# Patient Record
Sex: Male | Born: 1980 | Race: White | Hispanic: No | Marital: Married | Smoking: Never smoker
Health system: Southern US, Community
[De-identification: ages and names within clinical notes are randomized; demographics above are authoritative.]

## PROBLEM LIST (undated history)

## (undated) DIAGNOSIS — K589 Irritable bowel syndrome without diarrhea: Secondary | ICD-10-CM

## (undated) DIAGNOSIS — K649 Unspecified hemorrhoids: Secondary | ICD-10-CM

## (undated) HISTORY — DX: Unspecified hemorrhoids: K64.9

## (undated) HISTORY — DX: Gilbert syndrome: E80.4

## (undated) HISTORY — PX: WISDOM TOOTH EXTRACTION: SHX21

---

## 2014-07-14 ENCOUNTER — Ambulatory Visit (INDEPENDENT_AMBULATORY_CARE_PROVIDER_SITE_OTHER): Payer: BLUE CROSS/BLUE SHIELD | Admitting: Emergency Medicine

## 2014-07-14 VITALS — BP 132/64 | HR 91 | Temp 98.0°F | Resp 15 | Ht 69.25 in | Wt 178.0 lb

## 2014-07-14 DIAGNOSIS — Z0000 Encounter for general adult medical examination without abnormal findings: Secondary | ICD-10-CM | POA: Diagnosis not present

## 2014-07-14 LAB — CBC WITH DIFFERENTIAL/PLATELET
Basophils Absolute: 0 10*3/uL (ref 0.0–0.1)
Basophils Relative: 0 % (ref 0–1)
Eosinophils Absolute: 0.1 10*3/uL (ref 0.0–0.7)
Eosinophils Relative: 1 % (ref 0–5)
HCT: 46.6 % (ref 39.0–52.0)
Hemoglobin: 16 g/dL (ref 13.0–17.0)
Lymphocytes Relative: 16 % (ref 12–46)
Lymphs Abs: 2 10*3/uL (ref 0.7–4.0)
MCH: 29.3 pg (ref 26.0–34.0)
MCHC: 34.3 g/dL (ref 30.0–36.0)
MCV: 85.2 fL (ref 78.0–100.0)
MONO ABS: 0.6 10*3/uL (ref 0.1–1.0)
MONOS PCT: 5 % (ref 3–12)
MPV: 11.2 fL (ref 8.6–12.4)
NEUTROS ABS: 9.9 10*3/uL — AB (ref 1.7–7.7)
Neutrophils Relative %: 78 % — ABNORMAL HIGH (ref 43–77)
Platelets: 256 10*3/uL (ref 150–400)
RBC: 5.47 MIL/uL (ref 4.22–5.81)
RDW: 13.9 % (ref 11.5–15.5)
WBC: 12.7 10*3/uL — ABNORMAL HIGH (ref 4.0–10.5)

## 2014-07-14 LAB — POCT URINALYSIS DIPSTICK
BILIRUBIN UA: NEGATIVE
GLUCOSE UA: NEGATIVE
Ketones, UA: NEGATIVE
Leukocytes, UA: NEGATIVE
NITRITE UA: NEGATIVE
PH UA: 6
Protein, UA: NEGATIVE
RBC UA: NEGATIVE
Spec Grav, UA: 1.01
Urobilinogen, UA: 0.2

## 2014-07-14 NOTE — Progress Notes (Signed)
[facility] [address], [place] [address] [phone]  Date:  07/14/2014   Name:  Billy Phillips   DOB:  12/07/1980   MRN:  [id number]  PCP:  No primary care provider on file.    Chief Complaint: Annual Exam   History of Present Illness:  Billy Phillips is a 34 y.o. very pleasant male patient who presents with the following:  For PE.  No medical history of consequence Strong family history of HBP and HLD Nomedications Non smoker No improvement with over the counter medications or other home remedies.   Denies other complaint or health concern today.   There are no active problems to display for this patient.   History reviewed. No pertinent past medical history.  History reviewed. No pertinent past surgical history.  History  Substance Use Topics  . Smoking status: Never Smoker   . Smokeless tobacco: Not on file  . Alcohol Use: No    Family History  Problem Relation Age of Onset  . Hyperlipidemia Father   . Hypertension Father   . Hyperlipidemia Sister   . Cancer Maternal Grandmother   . Hyperlipidemia Paternal Grandmother   . Diabetes Paternal Grandfather     Not on File  Medication list has been reviewed and updated.  No current outpatient prescriptions on file prior to visit.   No current facility-administered medications on file prior to visit.    Review of Systems:  As per HPI, otherwise negative.    Physical Examination: Filed Vitals:   07/14/14 1354  BP: 160/70  Pulse: 91  Temp: 98 F (36.7 C)  Resp: 15   Filed Vitals:   07/14/14 1354  Height: 5' 9.25" (1.759 m)  Weight: 178 lb (80.74 kg)   Body mass index is 26.09 kg/(m^2). Ideal Body Weight: Weight in (lb) to have BMI = 25: 170.2  GEN: WDWN, NAD, Non-toxic, A & O x 3 HEENT: Atraumatic, Normocephalic. Neck supple. No masses, No LAD. Ears and Nose: No external deformity. CV: RRR, No M/G/R. No JVD. No thrill. No extra heart sounds. PULM: CTA  B, no wheezes, crackles, rhonchi. No retractions. No resp. distress. No accessory muscle use. ABD: S, NT, ND, +BS. No rebound. No HSM. EXTR: No c/c/e NEURO Normal gait.  PSYCH: Normally interactive. Conversant. Not depressed or anxious appearing.  Calm demeanor.    Assessment and Plan: Annual exam Labs pending  Signed,  Phillips OdorJeffery Meegan Shanafelt, MD

## 2014-07-15 LAB — COMPREHENSIVE METABOLIC PANEL
ALBUMIN: 5.1 g/dL (ref 3.5–5.2)
ALT: 19 U/L (ref 0–53)
AST: 16 U/L (ref 0–37)
Alkaline Phosphatase: 55 U/L (ref 39–117)
BUN: 15 mg/dL (ref 6–23)
CALCIUM: 9.5 mg/dL (ref 8.4–10.5)
CHLORIDE: 103 meq/L (ref 96–112)
CO2: 26 meq/L (ref 19–32)
CREATININE: 1.02 mg/dL (ref 0.50–1.35)
Glucose, Bld: 109 mg/dL — ABNORMAL HIGH (ref 70–99)
Potassium: 3.8 mEq/L (ref 3.5–5.3)
Sodium: 142 mEq/L (ref 135–145)
Total Bilirubin: 2.4 mg/dL — ABNORMAL HIGH (ref 0.2–1.2)
Total Protein: 7.1 g/dL (ref 6.0–8.3)

## 2014-07-15 LAB — LIPID PANEL
CHOL/HDL RATIO: 4 ratio
Cholesterol: 155 mg/dL (ref 0–200)
HDL: 39 mg/dL — AB (ref >=40)
LDL CALC: 101 mg/dL — AB (ref 0–99)
Triglycerides: 77 mg/dL (ref <150)
VLDL: 15 mg/dL (ref 0–40)

## 2014-07-28 ENCOUNTER — Ambulatory Visit (INDEPENDENT_AMBULATORY_CARE_PROVIDER_SITE_OTHER): Payer: BLUE CROSS/BLUE SHIELD

## 2014-07-28 ENCOUNTER — Other Ambulatory Visit: Payer: Self-pay | Admitting: Emergency Medicine

## 2014-07-28 ENCOUNTER — Telehealth: Payer: Self-pay

## 2014-07-28 ENCOUNTER — Ambulatory Visit (INDEPENDENT_AMBULATORY_CARE_PROVIDER_SITE_OTHER): Payer: BLUE CROSS/BLUE SHIELD | Admitting: Emergency Medicine

## 2014-07-28 VITALS — BP 122/74 | HR 90 | Temp 98.7°F | Resp 17 | Ht 69.5 in | Wt 175.0 lb

## 2014-07-28 DIAGNOSIS — G8929 Other chronic pain: Secondary | ICD-10-CM | POA: Diagnosis not present

## 2014-07-28 DIAGNOSIS — R739 Hyperglycemia, unspecified: Secondary | ICD-10-CM

## 2014-07-28 DIAGNOSIS — Z658 Other specified problems related to psychosocial circumstances: Secondary | ICD-10-CM | POA: Diagnosis not present

## 2014-07-28 DIAGNOSIS — R1010 Upper abdominal pain, unspecified: Secondary | ICD-10-CM | POA: Diagnosis not present

## 2014-07-28 DIAGNOSIS — R1011 Right upper quadrant pain: Principal | ICD-10-CM

## 2014-07-28 DIAGNOSIS — F439 Reaction to severe stress, unspecified: Secondary | ICD-10-CM

## 2014-07-28 DIAGNOSIS — R1012 Left upper quadrant pain: Secondary | ICD-10-CM | POA: Diagnosis not present

## 2014-07-28 LAB — POCT CBC
Granulocyte percent: 72.4 %G (ref 37–80)
HEMATOCRIT: 48.2 % (ref 43.5–53.7)
HEMOGLOBIN: 16 g/dL (ref 14.1–18.1)
Lymph, poc: 2.6 (ref 0.6–3.4)
MCH, POC: 28.7 pg (ref 27–31.2)
MCHC: 33.2 g/dL (ref 31.8–35.4)
MCV: 86.5 fL (ref 80–97)
MID (cbc): 0.4 (ref 0–0.9)
MPV: 8.5 fL (ref 0–99.8)
POC Granulocyte: 7.8 — AB (ref 2–6.9)
POC LYMPH %: 24.1 % (ref 10–50)
POC MID %: 3.5 %M (ref 0–12)
Platelet Count, POC: 293 10*3/uL (ref 142–424)
RBC: 5.57 M/uL (ref 4.69–6.13)
RDW, POC: 13.4 %
WBC: 10.8 10*3/uL — AB (ref 4.6–10.2)

## 2014-07-28 LAB — GLUCOSE, POCT (MANUAL RESULT ENTRY): POC Glucose: 90 mg/dl (ref 70–99)

## 2014-07-28 LAB — COMPREHENSIVE METABOLIC PANEL
ALBUMIN: 5.2 g/dL (ref 3.5–5.2)
ALK PHOS: 56 U/L (ref 39–117)
ALT: 21 U/L (ref 0–53)
AST: 18 U/L (ref 0–37)
BUN: 15 mg/dL (ref 6–23)
CALCIUM: 10 mg/dL (ref 8.4–10.5)
CO2: 25 mEq/L (ref 19–32)
CREATININE: 0.98 mg/dL (ref 0.50–1.35)
Chloride: 102 mEq/L (ref 96–112)
GLUCOSE: 84 mg/dL (ref 70–99)
Potassium: 3.9 mEq/L (ref 3.5–5.3)
Sodium: 140 mEq/L (ref 135–145)
Total Bilirubin: 2 mg/dL — ABNORMAL HIGH (ref 0.2–1.2)
Total Protein: 7.5 g/dL (ref 6.0–8.3)

## 2014-07-28 LAB — POCT UA - MICROSCOPIC ONLY
Bacteria, U Microscopic: NEGATIVE
CRYSTALS, UR, HPF, POC: NEGATIVE
Casts, Ur, LPF, POC: NEGATIVE
Mucus, UA: NEGATIVE
RBC, URINE, MICROSCOPIC: NEGATIVE
WBC, Ur, HPF, POC: NEGATIVE
YEAST UA: NEGATIVE

## 2014-07-28 LAB — POCT URINALYSIS DIPSTICK
BILIRUBIN UA: NEGATIVE
Blood, UA: NEGATIVE
GLUCOSE UA: NEGATIVE
KETONES UA: NEGATIVE
LEUKOCYTES UA: NEGATIVE
Nitrite, UA: NEGATIVE
Protein, UA: NEGATIVE
SPEC GRAV UA: 1.02
Urobilinogen, UA: 0.2
pH, UA: 6

## 2014-07-28 MED ORDER — DICYCLOMINE HCL 20 MG PO TABS: 20.0000 mg | ORAL_TABLET | Freq: Three times a day (TID) | ORAL | Status: DC

## 2014-07-28 NOTE — Progress Notes (Addendum)
Subjective:  This chart was scribed for Billy ChrisSteven Alain Deschene, MD by Carl Bestelina Holson, Medical Scribe. This patient was seen in Room 3 and the patient's care was started at 3:04 PM.   Patient ID: Billy Phillips, male    DOB: 01/05/1981, 34 y.o.   MRN: [id number]  HPI HPI Comments: Billy DickerChristopher Parma is a 34 y.o. male who [facility] complaining of intermittent abdominal pain that started a week ago after he daughter was born.  He states that his symptoms started with diarrhea and today while flexing his abdominal muscles, he felt some discomfort in his RUQ.  He has a longstanding history of intermittent constipation and diarrhea which he treats with probiotics.  He has been associating his symptoms with the stress of dealing with a newborn.  He states that his symptoms seemed to be worse after he ate cereal but is not sure if he is lactose or gluten intolerant.  He was last seen here two weeks ago for a CPE and states that he has lost some weight since that visit.  He did not tell the physician he was seen by about his symptoms at that time.  He denies decreased appetite as an associated symptom.   No past medical history on file. No past surgical history on file. Family History  Problem Relation Age of Onset  . Hyperlipidemia Father   . Hypertension Father   . Hyperlipidemia Sister   . Cancer Maternal Grandmother   . Hyperlipidemia Paternal Grandmother   . Diabetes Paternal Grandfather    History   Social History  . Marital Status: Unknown    Spouse Name: [name]  . Number of Children: 2  . Years of Education: 16   Occupational History  . Communications    Social History Main Topics  . Smoking status: Never Smoker   . Smokeless tobacco: Not on file  . Alcohol Use: No  . Drug Use: No  . Sexual Activity:    Partners: Female   Other Topics Concern  . Not on file   Social History Narrative   No Known Allergies  Review of Systems    Constitutional: Positive for unexpected weight change. Negative for appetite change.  Gastrointestinal: Positive for abdominal pain, diarrhea and constipation.     Objective:  Physical Exam  Constitutional: He is oriented to person, place, and time. He appears well-developed and well-nourished.  HENT:  Head: Normocephalic and atraumatic.  Eyes: EOM are normal.  Neck: Normal range of motion.  Cardiovascular: Normal rate, regular rhythm and normal heart sounds.  Exam reveals no gallop and no friction rub.   No murmur heard. Pulmonary/Chest: Effort normal and breath sounds normal. No respiratory distress. He has no wheezes. He has no rales.  Abdominal: Soft. He exhibits no distension. There is no rebound and no guarding.  Minimal RUQ discomfort.  Musculoskeletal: Normal range of motion.  Neurological: He is alert and oriented to person, place, and time.  Skin: Skin is warm and dry.  Psychiatric: He has a normal mood and affect. His behavior is normal.  Nursing note and vitals reviewed.  Results for orders placed or performed in visit on 07/28/14  POCT CBC  Result Value Ref Range   WBC 10.8 (A) 4.6 - 10.2 K/uL   Lymph, poc 2.6 0.6 - 3.4   POC LYMPH PERCENT 24.1 10 - 50 %L   MID (cbc) 0.4 0 - 0.9   POC MID % 3.5 0 - 12 %M  POC Granulocyte 7.8 (A) 2 - 6.9   Granulocyte percent 72.4 37 - 80 %G   RBC 5.57 4.69 - 6.13 M/uL   Hemoglobin 16.0 14.1 - 18.1 g/dL   HCT, POC 16.1 09.6 - 53.7 %   MCV 86.5 80 - 97 fL   MCH, POC 28.7 27 - 31.2 pg   MCHC 33.2 31.8 - 35.4 g/dL   RDW, POC 04.5 %   Platelet Count, POC 293 142 - 424 K/uL   MPV 8.5 0 - 99.8 fL  POCT urinalysis dipstick  Result Value Ref Range   Color, UA yellow    Clarity, UA clear    Glucose, UA neg    Bilirubin, UA neg    Ketones, UA neg    Spec Grav, UA 1.020    Blood, UA neg    pH, UA 6.0    Protein, UA neg    Urobilinogen, UA 0.2    Nitrite, UA neg    Leukocytes, UA Negative   POCT UA - Microscopic Only   Result Value Ref Range   WBC, Ur, HPF, POC neg    RBC, urine, microscopic neg    Bacteria, U Microscopic neg    Mucus, UA neg    Epithelial cells, urine per micros 0-1    Crystals, Ur, HPF, POC neg    Casts, Ur, LPF, POC neg    Yeast, UA neg   POCT glucose (manual entry)  Result Value Ref Range   POC Glucose 90 70 - 99 mg/dl   [facility] reading (PRIMARY) by  Dr. Cleta Alberts on the PA view there appears to be some blunting that was not confirmed on the lateral chest x-ray. Spleen shadow is prominent otherwise no acute changes were seen   BP 122/74 mmHg  Pulse 90  Temp(Src) 98.7 F (37.1 C) (Oral)  Resp 17  Ht 5' 9.5" (1.765 m)  Wt 175 lb (79.379 kg)  BMI 25.48 kg/m2  SpO2 98% Assessment & Plan:  1. Abdominal pain, chronic, right upper quadrant  - POCT CBC - POCT urinalysis dipstick - POCT UA - Microscopic Only - DG Abd Acute W/Chest; Future - Comprehensive metabolic panel - DG Chest 1 View; Future - CT Abdomen Pelvis W Contrast; Future Will treat with Bentyl and proceed with CT abdomen pelvis in the morning 2.  Stress Patient under a lot of stress at home with a 2 week newborn. He is taking care of the 39-1/2-year-old. He has been out of work and trying to help his wife. No medications for this  3. Hyperglycemia Repeat sugar normal - POCT glucose (manual entry)   I personally performed the services described in this documentation, which was scribed in my presence. The recorded information has been reviewed and is accurate.  Billy Chris, MD  [facility], [facility]  07/28/2014 4:13 PM

## 2014-07-28 NOTE — Patient Instructions (Signed)
Irritable Bowel Syndrome Irritable bowel syndrome (IBS) is caused by a disturbance of normal bowel function and is a common digestive disorder. You may also hear this condition called spastic colon, mucous colitis, and irritable colon. There is no cure for IBS. However, symptoms often gradually improve or disappear with a good diet, stress management, and medicine. This condition usually appears in late adolescence or early adulthood. Women develop it twice as often as men. CAUSES  After food has been digested and absorbed in the small intestine, waste material is moved into the large intestine, or colon. In the colon, water and salts are absorbed from the undigested products coming from the small intestine. The remaining residue, or fecal material, is held for elimination. Under normal circumstances, gentle, rhythmic contractions of the bowel walls push the fecal material along the colon toward the rectum. In IBS, however, these contractions are irregular and poorly coordinated. The fecal material is either retained too long, resulting in constipation, or expelled too soon, producing diarrhea. SIGNS AND SYMPTOMS  The most common symptom of IBS is abdominal pain. It is often in the lower left side of the abdomen, but it may occur anywhere in the abdomen. The pain comes from spasms of the bowel muscles happening too much and from the buildup of gas and fecal material in the colon. This pain:  Can range from sharp abdominal cramps to a dull, continuous ache.  Often worsens soon after eating.  Is often relieved by having a bowel movement or passing gas. Abdominal pain is usually accompanied by constipation, but it may also produce diarrhea. The diarrhea often occurs right after a meal or upon waking up in the morning. The stools are often soft, watery, and flecked with mucus. Other symptoms of IBS include:  Bloating.  Loss of appetite.  Heartburn.  Backache.  Dull pain in the arms or  shoulders.  Nausea.  Burping.  Vomiting.  Gas. IBS may also cause symptoms that are unrelated to the digestive system, such as:  Fatigue.  Headaches.  Anxiety.  Shortness of breath.  Trouble concentrating.  Dizziness. These symptoms tend to come and go. DIAGNOSIS  The symptoms of IBS may seem like symptoms of other, more serious digestive disorders. Your health care provider may want to perform tests to exclude these disorders.  TREATMENT Many medicines are available to help correct bowel function or relieve bowel spasms and abdominal pain. Among the medicines available are:  Laxatives for severe constipation and to help restore normal bowel habits.  Specific antidiarrheal medicines to treat severe or lasting diarrhea.  Antispasmodic agents to relieve intestinal cramps. Your health care provider may also decide to treat you with a mild tranquilizer or sedative during unusually stressful periods in your life. Your health care provider may also prescribe antidepressant medicine. The use of this medicine has been shown to reduce pain and other symptoms of IBS. Remember that if any medicine is prescribed for you, you should take it exactly as directed. Make sure your health care provider knows how well it worked for you. HOME CARE INSTRUCTIONS   Take all medicines as directed by your health care provider.  Avoid foods that are high in fat or oils, such as heavy cream, butter, frankfurters, sausage, and other fatty meats.  Avoid foods that make you go to the bathroom, such as fruit, fruit juice, and dairy products.  Cut out carbonated drinks, chewing gum, and "gassy" foods such as beans and cabbage. This may help relieve bloating and burping.    Eat foods with bran, and drink plenty of liquids with the bran foods. This helps relieve constipation.  Keep track of what foods seem to bring on your symptoms.  Avoid emotionally charged situations or circumstances that produce  anxiety.  Start or continue exercising.  Get plenty of rest and sleep. Document Released: 03/17/2005 Document Revised: 03/22/2013 Document Reviewed: 11/05/2007 ExitCare Patient Information 2015 [employer]. This information is not intended to replace advice given to you by your health care provider. Make sure you discuss any questions you have with your health care provider.  

## 2014-07-28 NOTE — Telephone Encounter (Signed)
Pt states that he was in for a physical a couple of weeks ago, and is now experiencing some upper abdominal pain. He requested to speak with a provider in regards to this, he states that he was not evaluated for these symptoms during his last OV. I advised pt that I would relay the message, but advised it may be necessary for him to RTC.

## 2014-07-28 NOTE — Telephone Encounter (Signed)
Pt states it is not necessarily pain, just discomfort. Around gallbladder area. Advised pt he needs to come in. He will rtc.

## 2014-07-31 ENCOUNTER — Ambulatory Visit: Payer: Self-pay | Admitting: Family

## 2014-07-31 ENCOUNTER — Telehealth: Payer: Self-pay

## 2014-07-31 NOTE — Telephone Encounter (Signed)
Please contact [employer] to give more clinical information to have patients CT Abdomen Pelvis w contrast approved. Pateints ID [id number] Group [id number] Dob 03/08/1981, procedure code # [id number] and diagnosis codes R10.10, G89.29. [employer] provider line call back number is [phone]. There was no case or reference number given by [employer].

## 2014-08-02 ENCOUNTER — Telehealth: Payer: Self-pay | Admitting: Emergency Medicine

## 2014-08-02 NOTE — Telephone Encounter (Signed)
Patient states that the Bentyl is giving him dry mouth. States that it has been this since he started the medication on Friday. He knows this is a common side effect but wants to know how long it will last.   [phone]

## 2014-08-02 NOTE — Telephone Encounter (Signed)
Side effects usually subside in 2-3 weeks. Have him keep a hard piece of candy in his mouth. If the symptoms persist we can lower the dosage

## 2014-08-02 NOTE — Telephone Encounter (Signed)
Gave pt message.

## 2014-08-02 NOTE — Telephone Encounter (Signed)
Can anyone comment on how long dry mouth will last?

## 2014-08-02 NOTE — Telephone Encounter (Signed)
Side effects vary per patient, however, can increase fluid and intake and/or can try Biotene dry mouth mouthwash.

## 2014-08-03 ENCOUNTER — Emergency Department (HOSPITAL_COMMUNITY): Payer: BLUE CROSS/BLUE SHIELD

## 2014-08-03 ENCOUNTER — Encounter (HOSPITAL_COMMUNITY): Payer: Self-pay | Admitting: Emergency Medicine

## 2014-08-03 ENCOUNTER — Emergency Department (HOSPITAL_COMMUNITY)
Admission: EM | Admit: 2014-08-03 | Discharge: 2014-08-03 | Disposition: A | Discharge status: Home / Self Care | Payer: BLUE CROSS/BLUE SHIELD | Attending: Emergency Medicine | Admitting: Emergency Medicine

## 2014-08-03 DIAGNOSIS — K8020 Calculus of gallbladder without cholecystitis without obstruction: Secondary | ICD-10-CM | POA: Diagnosis not present

## 2014-08-03 DIAGNOSIS — Z79899 Other long term (current) drug therapy: Secondary | ICD-10-CM | POA: Insufficient documentation

## 2014-08-03 DIAGNOSIS — R1010 Upper abdominal pain, unspecified: Secondary | ICD-10-CM

## 2014-08-03 DIAGNOSIS — R1011 Right upper quadrant pain: Secondary | ICD-10-CM | POA: Diagnosis present

## 2014-08-03 HISTORY — DX: Irritable bowel syndrome, unspecified: K58.9

## 2014-08-03 LAB — ALKALINE PHOSPHATASE ISOENZYMES
Alkaline Phonsphatase: 57 U/L (ref 40–115)
Bone Isoenzymes: 37 % (ref 28–66)
Intestinal Isoenzymes: 6 % (ref 1–24)
Liver Isoenzymes: 57 % (ref 25–69)
MACROHEPATIC ISOENZYMES: 0 %

## 2014-08-03 LAB — CBC WITH DIFFERENTIAL/PLATELET
Basophils Absolute: 0 10*3/uL (ref 0.0–0.1)
Basophils Relative: 0 % (ref 0–1)
Eosinophils Absolute: 0.1 10*3/uL (ref 0.0–0.7)
Eosinophils Relative: 1 % (ref 0–5)
HEMATOCRIT: 50.2 % (ref 39.0–52.0)
HEMOGLOBIN: 17.4 g/dL — AB (ref 13.0–17.0)
Lymphocytes Relative: 20 % (ref 12–46)
Lymphs Abs: 2.4 10*3/uL (ref 0.7–4.0)
MCH: 29.5 pg (ref 26.0–34.0)
MCHC: 34.7 g/dL (ref 30.0–36.0)
MCV: 85.1 fL (ref 78.0–100.0)
MONO ABS: 0.8 10*3/uL (ref 0.1–1.0)
MONOS PCT: 6 % (ref 3–12)
Neutro Abs: 8.7 10*3/uL — ABNORMAL HIGH (ref 1.7–7.7)
Neutrophils Relative %: 73 % (ref 43–77)
Platelets: 253 10*3/uL (ref 150–400)
RBC: 5.9 MIL/uL — ABNORMAL HIGH (ref 4.22–5.81)
RDW: 12.6 % (ref 11.5–15.5)
WBC: 11.9 10*3/uL — AB (ref 4.0–10.5)

## 2014-08-03 LAB — LIPASE, BLOOD: Lipase: 33 U/L (ref 22–51)

## 2014-08-03 LAB — COMPREHENSIVE METABOLIC PANEL
ALBUMIN: 5.3 g/dL — AB (ref 3.5–5.0)
ALK PHOS: 53 U/L (ref 38–126)
ALT: 29 U/L (ref 17–63)
ANION GAP: 10 (ref 5–15)
AST: 24 U/L (ref 15–41)
BUN: 12 mg/dL (ref 6–20)
CALCIUM: 9.7 mg/dL (ref 8.9–10.3)
CO2: 26 mmol/L (ref 22–32)
CREATININE: 1.13 mg/dL (ref 0.61–1.24)
Chloride: 103 mmol/L (ref 101–111)
GFR calc Af Amer: >60 mL/min (ref >60)
GFR calc non Af Amer: >60 mL/min (ref >60)
Glucose, Bld: 110 mg/dL — ABNORMAL HIGH (ref 70–99)
POTASSIUM: 4 mmol/L (ref 3.5–5.1)
Sodium: 139 mmol/L (ref 135–145)
TOTAL PROTEIN: 8.1 g/dL (ref 6.5–8.1)
Total Bilirubin: 3.1 mg/dL — ABNORMAL HIGH (ref 0.3–1.2)

## 2014-08-03 NOTE — ED Notes (Signed)
Per pt, has been worked up for same symptoms at [facility]-diagnosed with IBS-on Bentyl-states mid upper abdominal pain that is different and started today

## 2014-08-03 NOTE — ED Provider Notes (Addendum)
CSN: [id number]     Arrival date & time 08/03/14  1425 History   First MD Initiated Contact with Patient 08/03/14 1503     Chief Complaint  Patient presents with  . Abdominal Pain     (Consider location/radiation/quality/duration/timing/severity/associated sxs/prior Treatment) Patient is a 34 y.o. male presenting with abdominal pain. The history is provided by the patient.  Abdominal Pain Pain location:  RUQ and epigastric Pain quality: aching, bloating and fullness   Pain radiates to:  Does not radiate Pain severity:  Moderate Onset quality:  Gradual Duration:  2 weeks Timing:  Intermittent Progression:  Waxing and waning Chronicity:  New Context: eating   Context comment:  Also started after the birth of his most recent child 2 weeks ago Relieved by:  Nothing Worsened by:  Eating Ineffective treatments: bentyl. Associated symptoms: anorexia and diarrhea   Associated symptoms: no chest pain, no constipation, no cough, no dysuria, no fever, no hematuria, no nausea and no vomiting   Risk factors: no alcohol abuse, has not had multiple surgeries and no NSAID use     Past Medical History  Diagnosis Date  . IBS (irritable bowel syndrome)    No past surgical history on file. Family History  Problem Relation Age of Onset  . Hyperlipidemia Father   . Hypertension Father   . Hyperlipidemia Sister   . Cancer Maternal Grandmother   . Hyperlipidemia Paternal Grandmother   . Diabetes Paternal Grandfather    History  Substance Use Topics  . Smoking status: Never Smoker   . Smokeless tobacco: Not on file  . Alcohol Use: No    Review of Systems  Constitutional: Negative for fever.  Respiratory: Negative for cough.   Cardiovascular: Negative for chest pain.  Gastrointestinal: Positive for abdominal pain, diarrhea and anorexia. Negative for nausea, vomiting and constipation.  Genitourinary: Negative for dysuria and hematuria.  All other systems reviewed and are  negative.     Allergies  Review of patient's allergies indicates no known allergies.  Home Medications   Prior to Admission medications   Medication Sig Start Date End Date Taking? Authorizing Provider  dicyclomine (BENTYL) 20 MG tablet Take 1 tablet (20 mg total) by mouth 3 (three) times daily before meals. 07/28/14  Yes Collene GobbleSteven A Daub, MD  Multiple Vitamin (MULTIVITAMIN) tablet Take 1 tablet by mouth daily.   Yes Historical Provider, MD  OVER THE COUNTER MEDICATION Take 1 tablet by mouth daily. Fiber supplement   Yes Historical Provider, MD   BP 143/91 mmHg  Pulse 66  Temp(Src) 97.8 F (36.6 C) (Oral)  Resp 18  SpO2 100% Physical Exam  Constitutional: He is oriented to person, place, and time. He appears well-developed and well-nourished. No distress.  HENT:  Head: Normocephalic and atraumatic.  Mouth/Throat: Oropharynx is clear and moist.  Eyes: Conjunctivae and EOM are normal. Pupils are equal, round, and reactive to light.  Neck: Normal range of motion. Neck supple.  Cardiovascular: Normal rate, regular rhythm and intact distal pulses.   No murmur heard. Pulmonary/Chest: Effort normal and breath sounds normal. No respiratory distress. He has no wheezes. He has no rales.  Abdominal: Soft. Normal appearance. He exhibits no distension. There is no hepatosplenomegaly. There is tenderness in the right upper quadrant and epigastric area. There is no rebound, no guarding, no CVA tenderness and negative Murphy's sign. No hernia.  Musculoskeletal: Normal range of motion. He exhibits no edema or tenderness.  Neurological: He is alert and oriented to person, place, and time.  Skin: Skin is warm and dry. No rash noted. No erythema.  Psychiatric: He has a normal mood and affect. His behavior is normal.  Nursing note and vitals reviewed.   ED Course  Procedures (including critical care time) Labs Review Labs Reviewed  CBC WITH DIFFERENTIAL/PLATELET - Abnormal; Notable for the  following:    WBC 11.9 (*)    RBC 5.90 (*)    Hemoglobin 17.4 (*)    Neutro Abs 8.7 (*)    All other components within normal limits  COMPREHENSIVE METABOLIC PANEL - Abnormal; Notable for the following:    Glucose, Bld 110 (*)    Albumin 5.3 (*)    Total Bilirubin 3.1 (*)    All other components within normal limits  LIPASE, BLOOD  URINALYSIS, ROUTINE W REFLEX MICROSCOPIC    Imaging Review [place] Abdomen Limited  08/03/2014   CLINICAL DATA:  Abdominal pain and postprandial fullness for 1 week  EXAM: US ABDOMEN LIMITED - RIGHT UPPER QUADRANT  COMPARISON:  None.  FINDINGS: Gallbladder:  Within the gallbladder, there are echogenic foci which move and shadow consistent with gallstones. The largest gallstone measures 2.7 cm in length. There is no gallbladder wall thickening or pericholecystic fluid. No sonographic Murphy sign noted.  Common bile duct:  Diameter: 2 mm. There is no intrahepatic or extrahepatic biliary duct dilatation.  Liver:  No focal lesion identified. Within normal limits in parenchymal echogenicity.  IMPRESSION: Cholelithiasis.  Study otherwise unremarkable.   Electronically Signed   By: Bretta BangWilliam  Woodruff III M.D.   On: 08/03/2014 15:57     EKG Interpretation None      MDM   Final diagnoses:  Upper abdominal pain  Calculus of gallbladder without cholecystitis without obstruction    Patient presenting with intermittent upper abdominal pain for the last 2 weeks that has continued to worsen most significantly after eating. He was seen at urgent care approximately one week ago and at that time was diagnosed with IBS. He has attempted to take Bentyl which did not seem to help his symptoms significantly. He has had intermittent loose stools but denies any constipation, fever, nausea, vomiting. The pain continues to be in the right upper quadrant and epigastric area. Also feelings of fullness with eating just small amounts. He denies any medical history, surgeries or medications  except for the Bentyl he just started.  On exam patient has right upper quadrant and epigastric tenderness without Murphy sign. Patient's symptoms are suspicious for cholelithiasis. Also on prior labs done at urgent care in his PCP office he had an elevated T bili but normal LFTs and lipase.  Here today patient has a mild cytosis of 11.9 and elevated T bili of 3. Otherwise LFTs and lipase are within normal limits. Ultrasound shows cholelithiasis without signs of acute cholecystitis. Discussed findings with patient and he will follow-up with general surgery for cholecystectomy  Gwyneth SproutWhitney Feven Alderfer, MD 08/03/14 1626  Gwyneth SproutWhitney Griffon Herberg, MD 08/03/14 [phone]

## 2014-08-04 ENCOUNTER — Telehealth: Payer: Self-pay | Admitting: Emergency Medicine

## 2014-08-04 ENCOUNTER — Other Ambulatory Visit: Payer: Self-pay | Admitting: Emergency Medicine

## 2014-08-04 DIAGNOSIS — R1011 Right upper quadrant pain: Secondary | ICD-10-CM

## 2014-08-04 DIAGNOSIS — K8020 Calculus of gallbladder without cholecystitis without obstruction: Secondary | ICD-10-CM

## 2014-08-04 NOTE — Telephone Encounter (Signed)
Discussed case with patient. He will be seen general surgery and GI.

## 2014-08-04 NOTE — Telephone Encounter (Signed)
Dr Ian Malkinaub-can you advise?

## 2014-08-04 NOTE — Telephone Encounter (Signed)
I called and spoke with patient. He was seen in emergency room yesterday and ultrasound showed gallstones. His bilirubin is elevated at 3 with normal LFTs. I did place the referral to general surgery.

## 2014-08-04 NOTE — Telephone Encounter (Signed)
I spoke with Thayer Ohmhris again. I did put a in an order for him to see a GI specialist. He will be seeing  general surgery and GI.

## 2014-08-04 NOTE — Telephone Encounter (Signed)
Pt has since been to the ER and been diagnosed with gallstones by ultrasound. He has been referred to general surgery. Doubt that Dr Cleta Albertsaub still wants the CT but you may want to send his way to confirm.

## 2014-08-05 NOTE — Telephone Encounter (Signed)
No need to order the CT. He had an ultrasound instead. There are referrals to general surgery and GI in the record.

## 2014-08-05 NOTE — Telephone Encounter (Signed)
Referrals, I think Dr Ellis Parentsaub's response is really for you, forwarding.

## 2014-08-08 ENCOUNTER — Other Ambulatory Visit: Payer: Self-pay | Admitting: General Surgery

## 2014-08-18 NOTE — Patient Instructions (Signed)
Billy Phillips  08/18/2014   Your procedure is scheduled on: Wednesday 08/23/14  Report [facility]  Entrance and follow signs to               Short Stay Center at 5:30 AM.  Call this number if you have problems the morning of surgery [phone]   Remember: ONLY 1 PERSON MAY GO WITH YOU TO SHORT STAY TO GET  READY MORNING OF YOUR SURGERY.  Do not eat food or drink liquids :After Midnight.     Take these medicines the morning of surgery with A SIP OF WATER: None                               You may not have any metal on your body including hair pins and              piercings  Do not wear jewelry, make-up, lotions, powders or perfumes, deodorant             Do not wear nail polish.  Do not shave  48 hours prior to surgery.              Men may shave face and neck.   Do not bring valuables to the hospital. [facility] IS NOT             RESPONSIBLE   FOR VALUABLES.  Contacts, dentures or bridgework may not be worn into surgery.  Leave suitcase in the car. After surgery it may be brought to your room.     Patients discharged the day of surgery will not be allowed to drive home.  Name and phone number of your driver:  Special Instructions: N/A              Please read over the following fact sheets you were given: _____________________________________________________________________             [facility] - Pre[facility] Before surgery, you can play an important role.  Because skin is not sterile, your skin needs to be as free of germs as possible.  You can reduce the number of germs on your skin by washing with CHG (chlorahexidine gluconate) soap before surgery.  CHG is an antiseptic cleaner which kills germs and bonds with the skin to continue killing germs even after washing. Please DO NOT use if you have an allergy to CHG or antibacterial soaps.  If your skin becomes reddened/irritated stop using the CHG and inform your nurse when  you arrive at Short Stay. Do not shave (including legs and underarms) for at least 48 hours prior to the first CHG shower.  You may shave your face/neck. Please follow these instructions carefully:  1.  Shower with CHG Soap the night before surgery and the  morning of Surgery.  2.  If you choose to wash your hair, wash your hair first as usual with your  normal  shampoo.  3.  After you shampoo, rinse your hair and body thoroughly to remove the  shampoo.                           4.  Use CHG as you would any other liquid soap.  You can apply chg directly  to the skin and wash  Gently with a scrungie or clean washcloth.  5.  Apply the CHG Soap to your body ONLY FROM THE NECK DOWN.   Do not use on face/ open                           Wound or open sores. Avoid contact with eyes, ears mouth and genitals (private parts).                       Wash face,  Genitals (private parts) with your normal soap.             6.  Wash thoroughly, paying special attention to the area where your surgery  will be performed.  7.  Thoroughly rinse your body with warm water from the neck down.  8.  DO NOT shower/wash with your normal soap after using and rinsing off  the CHG Soap.                9.  Pat yourself dry with a clean towel.            10.  Wear clean pajamas.            11.  Place clean sheets on your bed the night of your first shower and do not  sleep with pets. Day of Surgery : Do not apply any lotions/deodorants the morning of surgery.  Please wear clean clothes to the hospital/surgery center.  FAILURE TO FOLLOW THESE INSTRUCTIONS MAY RESULT IN THE CANCELLATION OF YOUR SURGERY PATIENT SIGNATURE_________________________________  NURSE SIGNATURE__________________________________  ________________________________________________________________________

## 2014-08-21 ENCOUNTER — Encounter (HOSPITAL_COMMUNITY)
Admission: RE | Admit: 2014-08-21 | Discharge: 2014-08-21 | Disposition: A | Discharge status: Home / Self Care | Payer: BLUE CROSS/BLUE SHIELD | Source: Ambulatory Visit | Attending: General Surgery | Admitting: General Surgery

## 2014-08-21 ENCOUNTER — Encounter (HOSPITAL_COMMUNITY): Payer: Self-pay

## 2014-08-21 DIAGNOSIS — R630 Anorexia: Secondary | ICD-10-CM | POA: Diagnosis not present

## 2014-08-21 DIAGNOSIS — Z800 Family history of malignant neoplasm of digestive organs: Secondary | ICD-10-CM | POA: Diagnosis not present

## 2014-08-21 DIAGNOSIS — R634 Abnormal weight loss: Secondary | ICD-10-CM | POA: Diagnosis not present

## 2014-08-21 DIAGNOSIS — Z6825 Body mass index (BMI) 25.0-25.9, adult: Secondary | ICD-10-CM | POA: Diagnosis not present

## 2014-08-21 DIAGNOSIS — K8010 Calculus of gallbladder with chronic cholecystitis without obstruction: Secondary | ICD-10-CM | POA: Diagnosis not present

## 2014-08-21 NOTE — H&P (Signed)
Billy Phillips  Location: [facility] Patient #: [id number] DOB: [birth year] Married / Language: English / Race: White Male       History of Present Illness     [name] referred to me by Dr. Earl Lites and Dr. Gwyneth Sprout for evaluation of symptomatic gallstones. He is in no distress today. He has a one-year history of alternating diarrhea and constipation, and has been treated for irritable bowel syndrome. For the past 3 weeks, he is been having intermittent episodes of sharp right upper quadrant pain, anorexia. Some exacerbation of diarrhea. No vomiting. Clearly has fatty food intolerance. 8 pound weight loss over 1 month. Ultrasound shows several gallstones, largest 2.7 cm. No inflammation. CBD 2 mm. Labs WBC 11,900. Lipase 33. Total bilirubin was 3.1, but the rest of his liver function tests are normal. It is possible he may have Gilbert's syndrome. Otherwise fairly healthy. Married. His wife is with him. 2 children. Denies tobacco. Rare alcohol. Works at [employer] for [employer]. Family his history reveals his sister had cholecystectomy. Grandmother died of colon cancer. Mother has colon polyps.   Other Problems  Cholelithiasis Hemorrhoids  Past Surgical History  Oral Surgery  Diagnostic Studies History  Colonoscopy never  Allergies  No Known Drug Allergies05/12/2014  Medication History  No Current Medications Medications Reconciled  Social History  Alcohol use Occasional alcohol use. Caffeine use Carbonated beverages, Coffee, Tea. No drug use Tobacco use Never smoker.  Family History  Colon Cancer Family Members In General. Colon Polyps Mother. Hypertension Father.  Review of Systems General Present- Fatigue and Weight Loss. Not Present- Appetite Loss, Chills, Fever, Night Sweats and Weight Gain. Skin Not Present- Change in Wart/Mole, Dryness, Hives, Jaundice, New Lesions,  Non-Healing Wounds, Rash and Ulcer. HEENT Present- Yellow Eyes. Not Present- Earache, Hearing Loss, Hoarseness, Nose Bleed, Oral Ulcers, Ringing in the Ears, Seasonal Allergies, Sinus Pain, Sore Throat, Visual Disturbances and Wears glasses/contact lenses. Respiratory Not Present- Bloody sputum, Chronic Cough, Difficulty Breathing, Snoring and Wheezing. Breast Not Present- Breast Mass, Breast Pain, Nipple Discharge and Skin Changes. Cardiovascular Not Present- Chest Pain, Difficulty Breathing Lying Down, Leg Cramps, Palpitations, Rapid Heart Rate, Shortness of Breath and Swelling of Extremities. Gastrointestinal Present- Abdominal Pain, Bloating, Change in Bowel Habits and Excessive gas. Not Present- Bloody Stool, Chronic diarrhea, Constipation, Difficulty Swallowing, Gets full quickly at meals, Hemorrhoids, Indigestion, Nausea, Rectal Pain and Vomiting. Male Genitourinary Not Present- Blood in Urine, Change in Urinary Stream, Frequency, Impotence, Nocturia, Painful Urination, Urgency and Urine Leakage. Musculoskeletal Not Present- Back Pain, Joint Pain, Joint Stiffness, Muscle Pain, Muscle Weakness and Swelling of Extremities. Neurological Not Present- Decreased Memory, Fainting, Headaches, Numbness, Seizures, Tingling, Tremor, Trouble walking and Weakness. Psychiatric Not Present- Anxiety, Bipolar, Change in Sleep Pattern, Depression, Fearful and Frequent crying. Endocrine Not Present- Cold Intolerance, Excessive Hunger, Hair Changes, Heat Intolerance, Hot flashes and New Diabetes. Hematology Not Present- Easy Bruising, Excessive bleeding, Gland problems, HIV and Persistent Infections.   Vitals (Sonya Bynum CMA; 08/08/2014 10:09 AM)  Weight: 170 lb Height: 69in Body Surface Area: 1.94 m Body Mass Index: 25.1 kg/m Temp.: 84F(Temporal)  Pulse: 78 (Regular)  BP: 132/78 (Sitting, Left Arm, Standard)    Physical Exam  General Mental Status-Alert. General  Appearance-Consistent with stated age. Hydration-Well hydrated. Voice-Normal.  Head and Neck Head-normocephalic, atraumatic with no lesions or palpable masses.  Eye Eyeball - Bilateral-Extraocular movements intact. Sclera/Conjunctiva - Bilateral-No scleral icterus.  Chest and Lung Exam Chest and lung exam reveals -quiet, even  and easy respiratory effort with no use of accessory muscles and on auscultation, normal breath sounds, no adventitious sounds and normal vocal resonance. Inspection Chest Wall - Normal. Back - normal.  Cardiovascular Cardiovascular examination reveals -on palpation PMI is normal in location and amplitude, no palpable S3 or S4. Normal cardiac borders., normal heart sounds, regular rate and rhythm with no murmurs, carotid auscultation reveals no bruits and normal pedal pulses bilaterally.  Abdomen Inspection Inspection of the abdomen reveals - No Hernias. Skin - Scar - no surgical scars. Palpation/Percussion Palpation and Percussion of the abdomen reveal - Soft, Non Tender, No Rebound tenderness, No Rigidity (guarding) and No hepatosplenomegaly. Auscultation Auscultation of the abdomen reveals - Bowel sounds normal.  Neurologic Neurologic evaluation reveals -alert and oriented x 3 with no impairment of recent or remote memory. Mental Status-Normal.  Musculoskeletal Normal Exam - Left-Upper Extremity Strength Normal and Lower Extremity Strength Normal. Normal Exam - Right-Upper Extremity Strength Normal, Lower Extremity Weakness.    Assessment & Plan  GALLSTONES (574.20  K80.20)   Schedule for Surgery Your ultrasound shows numerous gallstones, but no obvious infection, and no obvious blockage of your common bile duct. Your pain attacks are typical for gallbladder colic, and this is very likely to continue in the future It sounds like you also have had irritable bowel syndrome, and you may continue with alternating diarrhea and  constipation after gallbladder surgery. We have discussed the indications, techniques, and numerous risk of the surgery in detail Please read the written information that I gave you you will be scheduled for laparoscopic cholecystectomy with cholangiogram, possible open cholecystectomy  IRRITABLE BOWEL SYNDROME WITH CONSTIPATION AND DIARRHEA (564.1  K58.0)    Chakira Jachim M. Derrell LollingIngram, M.D., [facility] General and Minimally invasive Surgery Breast and Colorectal Surgery Office:   [phone] Pager:   [phone]

## 2014-08-21 NOTE — Pre-Procedure Instructions (Addendum)
08-03-14 CBC w/ diff, CMP, lipase

## 2014-08-23 ENCOUNTER — Ambulatory Visit (HOSPITAL_COMMUNITY)
Admission: RE | Admit: 2014-08-23 | Discharge: 2014-08-23 | Disposition: A | Discharge status: Home / Self Care | Payer: BLUE CROSS/BLUE SHIELD | Source: Ambulatory Visit | Attending: General Surgery | Admitting: General Surgery

## 2014-08-23 ENCOUNTER — Ambulatory Visit (HOSPITAL_COMMUNITY): Payer: BLUE CROSS/BLUE SHIELD | Admitting: Certified Registered Nurse Anesthetist

## 2014-08-23 ENCOUNTER — Encounter (HOSPITAL_COMMUNITY)
Admission: RE | Disposition: A | Discharge status: Home / Self Care | Payer: Self-pay | Source: Ambulatory Visit | Attending: General Surgery

## 2014-08-23 ENCOUNTER — Ambulatory Visit (HOSPITAL_COMMUNITY): Payer: BLUE CROSS/BLUE SHIELD

## 2014-08-23 ENCOUNTER — Encounter (HOSPITAL_COMMUNITY): Payer: Self-pay | Admitting: *Deleted

## 2014-08-23 DIAGNOSIS — K8010 Calculus of gallbladder with chronic cholecystitis without obstruction: Secondary | ICD-10-CM | POA: Diagnosis present

## 2014-08-23 DIAGNOSIS — Z6825 Body mass index (BMI) 25.0-25.9, adult: Secondary | ICD-10-CM | POA: Insufficient documentation

## 2014-08-23 DIAGNOSIS — Z419 Encounter for procedure for purposes other than remedying health state, unspecified: Secondary | ICD-10-CM

## 2014-08-23 DIAGNOSIS — R634 Abnormal weight loss: Secondary | ICD-10-CM | POA: Insufficient documentation

## 2014-08-23 DIAGNOSIS — Z800 Family history of malignant neoplasm of digestive organs: Secondary | ICD-10-CM | POA: Insufficient documentation

## 2014-08-23 DIAGNOSIS — R630 Anorexia: Secondary | ICD-10-CM | POA: Insufficient documentation

## 2014-08-23 DIAGNOSIS — R1012 Left upper quadrant pain: Secondary | ICD-10-CM

## 2014-08-23 HISTORY — PX: CHOLECYSTECTOMY: SHX55

## 2014-08-23 SURGERY — LAPAROSCOPIC CHOLECYSTECTOMY WITH INTRAOPERATIVE CHOLANGIOGRAM
Anesthesia: General | Site: Abdomen

## 2014-08-23 MED ORDER — SODIUM CHLORIDE 0.9 % IV SOLN: 250.0000 mL | INTRAVENOUS | Status: DC | PRN

## 2014-08-23 MED ORDER — NEOSTIGMINE METHYLSULFATE 10 MG/10ML IV SOLN
INTRAVENOUS | Status: DC | PRN
  Administered 2014-08-23: 4 mg via INTRAVENOUS

## 2014-08-23 MED ORDER — ONDANSETRON HCL 4 MG/2ML IJ SOLN
INTRAMUSCULAR | Status: AC
  Filled 2014-08-23: qty 2

## 2014-08-23 MED ORDER — FENTANYL CITRATE (PF) 100 MCG/2ML IJ SOLN
INTRAMUSCULAR | Status: DC | PRN
  Administered 2014-08-23: 100 ug via INTRAVENOUS
  Administered 2014-08-23 (×3): 50 ug via INTRAVENOUS

## 2014-08-23 MED ORDER — ONDANSETRON HCL 4 MG/2ML IJ SOLN
INTRAMUSCULAR | Status: DC | PRN
Start: 2014-08-23 — End: 2014-08-23
  Administered 2014-08-23: 4 mg via INTRAVENOUS

## 2014-08-23 MED ORDER — CISATRACURIUM BESYLATE 20 MG/10ML IV SOLN
INTRAVENOUS | Status: AC
  Filled 2014-08-23: qty 10

## 2014-08-23 MED ORDER — BUPIVACAINE-EPINEPHRINE 0.5% -1:200000 IJ SOLN
INTRAMUSCULAR | Status: DC | PRN
  Administered 2014-08-23: 14 mL

## 2014-08-23 MED ORDER — CEFAZOLIN SODIUM-DEXTROSE 2-3 GM-% IV SOLR
INTRAVENOUS | Status: AC
  Filled 2014-08-23: qty 50

## 2014-08-23 MED ORDER — GLYCOPYRROLATE 0.2 MG/ML IJ SOLN
INTRAMUSCULAR | Status: DC | PRN
  Administered 2014-08-23: 0.6 mg via INTRAVENOUS

## 2014-08-23 MED ORDER — CEFAZOLIN SODIUM-DEXTROSE 2-3 GM-% IV SOLR
2.0000 g | INTRAVENOUS | Status: AC
  Administered 2014-08-23: 2 g via INTRAVENOUS

## 2014-08-23 MED ORDER — PROPOFOL 10 MG/ML IV BOLUS
INTRAVENOUS | Status: DC | PRN
  Administered 2014-08-23: 175 mg via INTRAVENOUS

## 2014-08-23 MED ORDER — FENTANYL CITRATE (PF) 100 MCG/2ML IJ SOLN: 25.0000 ug | INTRAMUSCULAR | Status: DC | PRN

## 2014-08-23 MED ORDER — PROPOFOL 10 MG/ML IV BOLUS
INTRAVENOUS | Status: AC
  Filled 2014-08-23: qty 20

## 2014-08-23 MED ORDER — OXYCODONE HCL 5 MG PO TABS
5.0000 mg | ORAL_TABLET | ORAL | Status: DC | PRN
  Administered 2014-08-23: 5 mg via ORAL
  Filled 2014-08-23: qty 1

## 2014-08-23 MED ORDER — LIDOCAINE HCL (CARDIAC) 20 MG/ML IV SOLN
INTRAVENOUS | Status: DC | PRN
  Administered 2014-08-23: 75 mg via INTRAVENOUS

## 2014-08-23 MED ORDER — BUPIVACAINE HCL (PF) 0.5 % IJ SOLN
INTRAMUSCULAR | Status: AC
  Filled 2014-08-23: qty 30

## 2014-08-23 MED ORDER — SODIUM CHLORIDE 0.9 % IJ SOLN: 3.0000 mL | Freq: Two times a day (BID) | INTRAMUSCULAR | Status: DC

## 2014-08-23 MED ORDER — MIDAZOLAM HCL 5 MG/5ML IJ SOLN
INTRAMUSCULAR | Status: DC | PRN
  Administered 2014-08-23 (×2): 1 mg via INTRAVENOUS

## 2014-08-23 MED ORDER — CHLORHEXIDINE GLUCONATE 4 % EX LIQD
1.0000 | Freq: Once | CUTANEOUS | Status: DC
Start: 2014-08-24 — End: 2014-08-23

## 2014-08-23 MED ORDER — HYDROMORPHONE HCL 1 MG/ML IJ SOLN: 0.2500 mg | INTRAMUSCULAR | Status: DC | PRN

## 2014-08-23 MED ORDER — SODIUM CHLORIDE 0.9 % IJ SOLN: 3.0000 mL | INTRAMUSCULAR | Status: DC | PRN

## 2014-08-23 MED ORDER — HYDROCODONE-ACETAMINOPHEN 5-325 MG PO TABS: 1.0000 | ORAL_TABLET | Freq: Four times a day (QID) | ORAL | Status: DC | PRN

## 2014-08-23 MED ORDER — LIDOCAINE HCL (CARDIAC) 20 MG/ML IV SOLN
INTRAVENOUS | Status: AC
  Filled 2014-08-23: qty 5

## 2014-08-23 MED ORDER — EPHEDRINE SULFATE 50 MG/ML IJ SOLN
INTRAMUSCULAR | Status: AC
  Filled 2014-08-23: qty 1

## 2014-08-23 MED ORDER — DEXAMETHASONE SODIUM PHOSPHATE 10 MG/ML IJ SOLN
INTRAMUSCULAR | Status: DC | PRN
  Administered 2014-08-23: 10 mg via INTRAVENOUS

## 2014-08-23 MED ORDER — METOCLOPRAMIDE HCL 5 MG/ML IJ SOLN
INTRAMUSCULAR | Status: AC
  Filled 2014-08-23: qty 2

## 2014-08-23 MED ORDER — GLYCOPYRROLATE 0.2 MG/ML IJ SOLN
INTRAMUSCULAR | Status: AC
  Filled 2014-08-23: qty 3

## 2014-08-23 MED ORDER — CISATRACURIUM BESYLATE (PF) 10 MG/5ML IV SOLN
INTRAVENOUS | Status: DC | PRN
  Administered 2014-08-23: 10 mg via INTRAVENOUS

## 2014-08-23 MED ORDER — FENTANYL CITRATE (PF) 250 MCG/5ML IJ SOLN
INTRAMUSCULAR | Status: AC
  Filled 2014-08-23: qty 5

## 2014-08-23 MED ORDER — SODIUM CHLORIDE 0.9 % IJ SOLN
INTRAMUSCULAR | Status: AC
  Filled 2014-08-23: qty 10

## 2014-08-23 MED ORDER — BUPIVACAINE-EPINEPHRINE (PF) 0.5% -1:200000 IJ SOLN
INTRAMUSCULAR | Status: AC
  Filled 2014-08-23: qty 30

## 2014-08-23 MED ORDER — ACETAMINOPHEN 10 MG/ML IV SOLN
1000.0000 mg | Freq: Once | INTRAVENOUS | Status: AC
  Administered 2014-08-23: 1000 mg via INTRAVENOUS
  Filled 2014-08-23: qty 100

## 2014-08-23 MED ORDER — DEXAMETHASONE SODIUM PHOSPHATE 10 MG/ML IJ SOLN
INTRAMUSCULAR | Status: AC
  Filled 2014-08-23: qty 1

## 2014-08-23 MED ORDER — LACTATED RINGERS IR SOLN
Status: DC | PRN
  Administered 2014-08-23: 1000 mL

## 2014-08-23 MED ORDER — IOHEXOL 300 MG/ML  SOLN
INTRAMUSCULAR | Status: DC | PRN
  Administered 2014-08-23: 1.5 mL

## 2014-08-23 MED ORDER — ACETAMINOPHEN 325 MG PO TABS: 650.0000 mg | ORAL_TABLET | ORAL | Status: DC | PRN

## 2014-08-23 MED ORDER — LACTATED RINGERS IV SOLN
INTRAVENOUS | Status: DC | PRN
  Administered 2014-08-23 (×2): via INTRAVENOUS

## 2014-08-23 MED ORDER — NEOSTIGMINE METHYLSULFATE 10 MG/10ML IV SOLN
INTRAVENOUS | Status: AC
Start: 2014-08-23 — End: 2014-08-23
  Filled 2014-08-23: qty 1

## 2014-08-23 MED ORDER — MIDAZOLAM HCL 2 MG/2ML IJ SOLN
INTRAMUSCULAR | Status: AC
  Filled 2014-08-23: qty 2

## 2014-08-23 MED ORDER — 0.9 % SODIUM CHLORIDE (POUR BTL) OPTIME
TOPICAL | Status: DC | PRN
  Administered 2014-08-23: 1000 mL

## 2014-08-23 MED ORDER — ACETAMINOPHEN 650 MG RE SUPP
650.0000 mg | RECTAL | Status: DC | PRN
  Filled 2014-08-23: qty 1

## 2014-08-23 MED ORDER — SODIUM CHLORIDE 0.9 % IV SOLN: INTRAVENOUS | Status: DC

## 2014-08-23 MED ORDER — METOCLOPRAMIDE HCL 5 MG/ML IJ SOLN
INTRAMUSCULAR | Status: DC | PRN
  Administered 2014-08-23: 5 mg via INTRAVENOUS

## 2014-08-23 SURGICAL SUPPLY — 29 items
APPLIER CLIP ROT 10 11.4 M/L (STAPLE) ×2
BENZOIN TINCTURE PRP APPL 2/3 (GAUZE/BANDAGES/DRESSINGS) IMPLANT
CLIP APPLIE ROT 10 11.4 M/L (STAPLE) ×1 IMPLANT
COVER MAYO STAND STRL (DRAPES) ×2 IMPLANT
DECANTER SPIKE VIAL GLASS SM (MISCELLANEOUS) IMPLANT
DERMABOND ADVANCED (GAUZE/BANDAGES/DRESSINGS) ×1
DERMABOND ADVANCED .7 DNX12 (GAUZE/BANDAGES/DRESSINGS) ×1 IMPLANT
DRAPE C-ARM 42X120 X-RAY (DRAPES) ×2 IMPLANT
DRAPE LAPAROSCOPIC ABDOMINAL (DRAPES) ×2 IMPLANT
ELECT REM PT RETURN 9FT ADLT (ELECTROSURGICAL) ×2
ELECTRODE REM PT RTRN 9FT ADLT (ELECTROSURGICAL) ×1 IMPLANT
GLOVE EUDERMIC 7 POWDERFREE (GLOVE) ×2 IMPLANT
GOWN STRL REUS W/TWL XL LVL3 (GOWN DISPOSABLE) ×8 IMPLANT
HEMOSTAT SNOW SURGICEL 2X4 (HEMOSTASIS) IMPLANT
KIT BASIN OR (CUSTOM PROCEDURE TRAY) ×2 IMPLANT
LIQUID BAND (GAUZE/BANDAGES/DRESSINGS) IMPLANT
POUCH SPECIMEN RETRIEVAL 10MM (ENDOMECHANICALS) ×2 IMPLANT
SCISSORS LAP 5X35 DISP (ENDOMECHANICALS) ×2 IMPLANT
SET CHOLANGIOGRAPH MIX (MISCELLANEOUS) ×2 IMPLANT
SET IRRIG TUBING LAPAROSCOPIC (IRRIGATION / IRRIGATOR) ×2 IMPLANT
SLEEVE XCEL OPT CAN 5 100 (ENDOMECHANICALS) ×2 IMPLANT
STRIP CLOSURE SKIN 1/2X4 (GAUZE/BANDAGES/DRESSINGS) IMPLANT
SUT MNCRL AB 4-0 PS2 18 (SUTURE) ×2 IMPLANT
TOWEL OR 17X26 10 PK STRL BLUE (TOWEL DISPOSABLE) ×2 IMPLANT
TOWEL OR NON WOVEN STRL DISP B (DISPOSABLE) ×2 IMPLANT
TRAY LAPAROSCOPIC (CUSTOM PROCEDURE TRAY) ×2 IMPLANT
TROCAR BLADELESS OPT 5 100 (ENDOMECHANICALS) ×2 IMPLANT
TROCAR XCEL BLUNT TIP 100MML (ENDOMECHANICALS) ×2 IMPLANT
TROCAR XCEL NON-BLD 11X100MML (ENDOMECHANICALS) ×2 IMPLANT

## 2014-08-23 NOTE — Anesthesia Preprocedure Evaluation (Signed)
Anesthesia Evaluation  Patient identified by MRN, date of birth, ID band Patient awake    Reviewed: Allergy & Precautions, NPO status , Patient's Chart, lab work & pertinent test results  Airway Mallampati: II  TM Distance: >3 FB Neck ROM: Full    Dental no notable dental hx.    Pulmonary neg pulmonary ROS,  breath sounds clear to auscultation  Pulmonary exam normal       Cardiovascular Exercise Tolerance: Good negative cardio ROS Normal cardiovascular examRhythm:Regular Rate:Normal     Neuro/Psych negative neurological ROS  negative psych ROS   GI/Hepatic negative GI ROS, Neg liver ROS,   Endo/Other  negative endocrine ROS  Renal/GU negative Renal ROS  negative genitourinary   Musculoskeletal negative musculoskeletal ROS (+)   Abdominal   Peds negative pediatric ROS (+)  Hematology negative hematology ROS (+)   Anesthesia Other Findings   Reproductive/Obstetrics negative OB ROS                             Anesthesia Physical Anesthesia Plan  ASA: II  Anesthesia Plan: General   Post-op Pain Management:    Induction: Intravenous  Airway Management Planned: Oral ETT  Additional Equipment:   Intra-op Plan:   Post-operative Plan: Extubation in OR  Informed Consent: I have reviewed the patients History and Physical, chart, labs and discussed the procedure including the risks, benefits and alternatives for the proposed anesthesia with the patient or authorized representative who has indicated his/her understanding and acceptance.   Dental advisory given  Plan Discussed with: CRNA  Anesthesia Plan Comments:         Anesthesia Quick Evaluation

## 2014-08-23 NOTE — Transfer of Care (Signed)
Immediate Anesthesia Transfer of Care Note  Patient: Billy Phillips  Procedure(s) Performed: Procedure(s): LAPAROSCOPIC CHOLECYSTECTOMY WITH INTRAOPERATIVE CHOLANGIOGRAM  (N/A)  Patient Location: PACU  Anesthesia Type:General  Level of Consciousness: awake, oriented, patient cooperative, lethargic and responds to stimulation  Airway & Oxygen Therapy: Patient Spontanous Breathing and Patient connected to face mask oxygen  Post-op Assessment: Report given to RN, Post -op Vital signs reviewed and stable and Patient moving all extremities  Post vital signs: Reviewed and stable  Last Vitals:  Filed Vitals:   08/23/14 1003  BP: 132/62  Pulse: 98  Temp:   Resp: 17    Complications: No apparent anesthesia complications

## 2014-08-23 NOTE — Op Note (Signed)
Patient Name:           Billy Phillips    Pre op Diagnosis:      Chronic cholecystitis with cholelithiasis  Post op Diagnosis:    Chronic cholecystitis with cholelithiasis  Date of Surgery:        08/23/2014                                      Suspect Gilbert's syndrome  Procedure:                 Laparoscopic cholecystectomy with cholangiogram  Surgeon:                     Angelia MouldHaywood M. Derrell LollingIngram, M.D., FACS  Assistant:                      Or staff  Operative Indications:   [name] referred to me by Dr. Earl LitesSteve Daub and Dr. Gwyneth SproutWhitney Plunkett for evaluation of symptomatic gallstones. . He has a one-year history of alternating diarrhea and constipation, and has been treated for irritable bowel syndrome. For the past few weeks, he has been having intermittent episodes of sharp right upper quadrant pain, anorexia. Some exacerbation of diarrhea. No vomiting. Clearly has fatty food intolerance. 8 pound weight loss over 1 month. Ultrasound shows several gallstones, largest 2.7 cm. No inflammation. CBD 2 mm. Labs WBC 11,900. Lipase 33. Total bilirubin was 3.1, but the rest of his liver function tests are normal. It is possible he may have Gilbert's syndrome. Otherwise fairly healthy.   Works at [employer] for [employer].   Operative Findings:       The gallbladder was chronically inflamed. There were moderate adhesions to the body and infundibulum which had to be taken down. The cholangiogram was normal, showing normal intrahepatic and extra hepatic biliary anatomy, no filling defect, no dilatation, and good flow of contrast into the duodenum. The liver, stomach, duodenum, colon, and small bowel looked normal.  Procedure in Detail:          Following the induction of general endotracheal anesthesia the patient's abdomen was prepped and draped in a sterile fashion. Intravenous antibodies were given. Surgical timeout was performed. 0.5% Marcaine with  epinephrine was used as local infiltration and aesthetic.      A Hassan trocar was inserted in the infraumbilical position with open technique. Insertion was uneventful. This was secured with the Purstring suture of 0-vicryl.  . Pneumoperitoneum was created. Camera was inserted. Trochars placed in subxiphoid region and two 5 mm trochars placed in the right midabdomen. The gallbladder fundus was easily identified and elevated. We spent about 5 or 10 minutes taking down all the adhesions. We ultimately isolated the cystic duct and cystic artery and created a large window behind these 2 structures. The cholangiogram catheter was inserted into the cystic duct and a cholangiogram was obtained using the C-arm. The cholangiogram was normal as described above. The cholangiogram catheter was removed, the cystic duct and cystic artery secured with multiple medical clips and divided. Gallbladder was dissected from its bed with electrocautery. A small posterior branch of the cystic artery was controlled with  metal clips. Once the gallbladder was completely dissected it was placed in a specimen bag and removed. The operative field was irrigated until the irrigation fluid was completely clear. The gallbladder bed was dry. There  is no evidence of bleeding or bile leak. The trochars were removed and the pneumoperitoneum released. The fascia at the umbilicus was closed with interrupted suture of 0 Vicryl and a Purstring suture of 0 vicryl.  The skin incisions were irrigated and closed with subcuticular 4-0 Monocryl and Dermabond. Patient tolerated the procedure well was taken to PACU in stable condition. EBL 10 mL. Counts correct. Complications none.     Angelia Mould. Derrell Lolling, M.D., FACS General and Minimally Invasive Surgery Breast and Colorectal Surgery  08/23/2014 8:53 AM

## 2014-08-23 NOTE — Anesthesia Postprocedure Evaluation (Signed)
  Anesthesia Post-op Note  Patient: Billy Phillips  Procedure(s) Performed: Procedure(s) (LRB): LAPAROSCOPIC CHOLECYSTECTOMY WITH INTRAOPERATIVE CHOLANGIOGRAM  (N/A)  Patient Location: PACU  Anesthesia Type: General  Level of Consciousness: awake and alert   Airway and Oxygen Therapy: Patient Spontanous Breathing  Post-op Pain: mild  Post-op Assessment: Post-op Vital signs reviewed, Patient's Cardiovascular Status Stable, Respiratory Function Stable, Patent Airway and No signs of Nausea or vomiting  Last Vitals:  Filed Vitals:   08/23/14 1204  BP: 127/73  Pulse: 68  Temp:   Resp: 16    Post-op Vital Signs: stable   Complications: No apparent anesthesia complications. Possible idiosyncratic response to neostigmine. Recovered without complication

## 2014-08-23 NOTE — Discharge Instructions (Signed)
[facility] ______[facility] °LAPAROSCOPIC SURGERY: POST OP INSTRUCTIONS °Always review your discharge instruction sheet given to you by the facility where your surgery was performed. °IF YOU HAVE DISABILITY OR FAMILY LEAVE FORMS, YOU MUST BRING THEM TO THE OFFICE FOR PROCESSING.   °DO NOT GIVE THEM TO YOUR DOCTOR. ° °1. A prescription for pain medication may be given to you upon discharge.  Take your pain medication as prescribed, if needed.  If narcotic pain medicine is not needed, then you may take acetaminophen (Tylenol) or ibuprofen (Advil) as needed. °2. Take your usually prescribed medications unless otherwise directed. °3. If you need a refill on your pain medication, please contact your pharmacy.  They will contact our office to request authorization. Prescriptions will not be filled after 5pm or on week-ends. °4. You should follow a light diet the first few days after arrival home, such as soup and crackers, etc.  Be sure to include lots of fluids daily. °5. Most patients will experience some swelling and bruising in the area of the incisions.  Ice packs will help.  Swelling and bruising can take several days to resolve.  °6. It is common to experience some constipation if taking pain medication after surgery.  Increasing fluid intake and taking a stool softener (such as Colace) will usually help or prevent this problem from occurring.  A mild laxative (Milk of Magnesia or Miralax) should be taken according to package instructions if there are no bowel movements after 48 hours. °7. Unless discharge instructions indicate otherwise, you may remove your bandages 24-48 hours after surgery, and you may shower at that time.  You may have steri-strips (small skin tapes) in place directly over the incision.  These strips should be left on the skin for 7-10 days.  If your surgeon used skin glue on the incision, you may shower in 24 hours.  The glue will flake off over the next 2-3 weeks.  Any sutures or  staples will be removed at the office during your follow-up visit. °8. ACTIVITIES:  You may resume regular (light) daily activities beginning the next day--such as daily self-care, walking, climbing stairs--gradually increasing activities as tolerated.  You may have sexual intercourse when it is comfortable.  Refrain from any heavy lifting or straining until approved by your doctor. °a. You may drive when you are no longer taking prescription pain medication, you can comfortably wear a seatbelt, and you can safely maneuver your car and apply brakes. °b. RETURN TO WORK:  __________________________________________________________ °9. You should see your doctor in the office for a follow-up appointment approximately 2-3 weeks after your surgery.  Make sure that you call for this appointment within a day or two after you arrive home to insure a convenient appointment time. °10. OTHER INSTRUCTIONS: __________________________________________________________________________________________________________________________ __________________________________________________________________________________________________________________________ °WHEN TO CALL YOUR DOCTOR: °1. Fever over 101.0 °2. Inability to urinate °3. Continued bleeding from incision. °4. Increased pain, redness, or drainage from the incision. °5. Increasing abdominal pain ° °The clinic staff is available to answer your questions during regular business hours.  Please don’t hesitate to call and ask to speak to one of the nurses for clinical concerns.  If you have a medical emergency, go to the nearest emergency room or call 911.  A surgeon from [facility] is always on call at the hospital. °[address], [address], [place], [place]  [address] ? [address] [address], [place], [place]   [address] °[phone] ? [phone] ? FAX [phone] °Web site:   [email or web address] °

## 2014-08-23 NOTE — Interval H&P Note (Signed)
History and Physical Interval Note:  08/23/2014 6:55 AM  Billy Phillips  has presented today for surgery, with the diagnosis of gallstones  The various methods of treatment have been discussed with the patient and family. After consideration of risks, benefits and other options for treatment, the patient has consented to  Procedure(s): LAPAROSCOPIC CHOLECYSTECTOMY WITH INTRAOPERATIVE CHOLANGIOGRAM POSSIBLE OPEN (N/A) as a surgical intervention .  The patient's history has been reviewed, patient examined, no change in status, stable for surgery.  I have reviewed the patient's chart and labs.  Questions were answered to the patient's satisfaction.     Ernestene MentionINGRAM,[name] M

## 2014-08-23 NOTE — Anesthesia Procedure Notes (Signed)
Procedure Name: Intubation Date/Time: 08/23/2014 7:14 AM Performed by: Edison PaceGRAY, Rae Plotner E Pre-anesthesia Checklist: Patient identified, Timeout performed, Emergency Drugs available, Suction available and Patient being monitored Patient Re-evaluated:Patient Re-evaluated prior to inductionOxygen Delivery Method: Circle system utilized Preoxygenation: Pre-oxygenation with 100% oxygen Intubation Type: IV induction and Cricoid Pressure applied Ventilation: Mask ventilation without difficulty Grade View: Grade II Tube type: Oral Tube size: 7.5 mm Number of attempts: 1 Airway Equipment and Method: Stylet Placement Confirmation: ETT inserted through vocal cords under direct vision,  positive ETCO2 and breath sounds checked- equal and bilateral Secured at: 21 cm Tube secured with: Tape Dental Injury: Teeth and Oropharynx as per pre-operative assessment

## 2014-08-24 ENCOUNTER — Encounter (HOSPITAL_COMMUNITY): Payer: Self-pay | Admitting: General Surgery

## 2014-09-07 ENCOUNTER — Encounter: Payer: Self-pay | Admitting: Emergency Medicine

## 2014-10-16 ENCOUNTER — Ambulatory Visit: Payer: BLUE CROSS/BLUE SHIELD | Admitting: Family Medicine

## 2014-11-16 ENCOUNTER — Ambulatory Visit: Payer: BLUE CROSS/BLUE SHIELD | Admitting: Family Medicine

## 2014-11-27 ENCOUNTER — Ambulatory Visit (INDEPENDENT_AMBULATORY_CARE_PROVIDER_SITE_OTHER): Payer: BLUE CROSS/BLUE SHIELD | Admitting: Family Medicine

## 2014-11-27 VITALS — BP 128/80 | HR 78 | Temp 98.2°F | Resp 16 | Ht 70.0 in | Wt 172.0 lb

## 2014-11-27 DIAGNOSIS — R590 Localized enlarged lymph nodes: Secondary | ICD-10-CM

## 2014-11-27 DIAGNOSIS — L309 Dermatitis, unspecified: Secondary | ICD-10-CM | POA: Diagnosis not present

## 2014-11-27 DIAGNOSIS — Z23 Encounter for immunization: Secondary | ICD-10-CM

## 2014-11-27 DIAGNOSIS — H1011 Acute atopic conjunctivitis, right eye: Secondary | ICD-10-CM | POA: Diagnosis not present

## 2014-11-27 LAB — CBC
HCT: 47.2 % (ref 39.0–52.0)
HEMOGLOBIN: 16.4 g/dL (ref 13.0–17.0)
MCH: 29.6 pg (ref 26.0–34.0)
MCHC: 34.7 g/dL (ref 30.0–36.0)
MCV: 85.2 fL (ref 78.0–100.0)
MPV: 11.2 fL (ref 8.6–12.4)
PLATELETS: 246 10*3/uL (ref 150–400)
RBC: 5.54 MIL/uL (ref 4.22–5.81)
RDW: 13.4 % (ref 11.5–15.5)
WBC: 8.5 10*3/uL (ref 4.0–10.5)

## 2014-11-27 MED ORDER — BETAMETHASONE DIPROPIONATE 0.05 % EX CREA: TOPICAL_CREAM | Freq: Two times a day (BID) | CUTANEOUS | Status: DC

## 2014-11-27 NOTE — Progress Notes (Signed)
Subjective:    Patient ID: Billy Phillips, male    DOB: 1980/06/07, 34 y.o.   MRN: [id number]  HPI This is a 34 yo male who presents today with right eye swelling and itching that started 2 days ago. He took an antihistamine that helped with itching and started using antihistamine eye drops today. It has improved over the last 24 hours with decreased swelling and itching. Is a little matted in the morning, but no drainage throughout the day. He has noticed a stuffy nose for 3 months, has started nasacort with some improvement.   He has a history of eczema on his hands and is currently having a flare. He uses Aquaphor, but has noticed swelling recently. Has used OTC hydrocortisone cream without improvement. Is concerned about using steroid preparations in general due to potential for skin thinning. Has not used a prescription strength steroid cream in over a year. No history of asthma. He has made an appointment with a dermatologist for October.   Noticed an elardged lymph node yesterday under his right arm while showering. It is painless. No contact with cats.    Past Medical History  Diagnosis Date  . IBS (irritable bowel syndrome)   . Sullivan Lone syndrome    Past Surgical History  Procedure Laterality Date  . Cholecystectomy N/A 08/23/2014    Procedure: LAPAROSCOPIC CHOLECYSTECTOMY WITH INTRAOPERATIVE CHOLANGIOGRAM ;  Surgeon: Claud Kelp, MD;  Location: [facility];  Service: General;  Laterality: N/A;   Family History  Problem Relation Age of Onset  . Hyperlipidemia Father   . Hypertension Father   . Hyperlipidemia Sister   . Cancer Maternal Grandmother   . Hyperlipidemia Paternal Grandmother   . Diabetes Paternal Grandfather    Social History  Substance Use Topics  . Smoking status: Never Smoker   . Smokeless tobacco: None  . Alcohol Use: No   Review of Systems No fever, no chills, no rash. No ear pain, no nasal drainage, had slight sore throat last week, none today. No  visual changes, no foreign body sensation, no drainage, no pain, + itching right eye.     Objective:   Physical Exam Physical Exam  Constitutional: Oriented to person, place, and time. He appears well-developed and well-nourished.  HENT:  Head: Normocephalic and atraumatic.  Eyes: Conjunctivae are normal. Right lower eye lid slightly puffy, no erythema, no drainage. Ears: bilateral canals and TMs normal Nose: right nare with some erythema, left nare normal Throat: clear Neck: Normal range of motion. Neck supple. No lymphadenopathy.  Cardiovascular: Regular rhythm.   Pulmonary/Chest: Effort normal. Lymph: Singular, mobile, 5 mm right axillary lymph node palpated. No erythema or tenderness. No palpable node on left side.  Musculoskeletal: Normal range of motion.  Neurological: Alert and oriented to person, place, and time.  Skin: Skin is warm and dry. Right 3,4th fingers with erythematous papules and crusting.  Psychiatric: Normal mood and affect. Behavior is normal. Judgment and thought content normal.  Vitals reviewed. BP 128/80 mmHg  Pulse 78  Temp(Src) 98.2 F (36.8 C) (Oral)  Resp 16  Ht 5\' 10"  (1.778 m)  Wt 172 lb (78.019 kg)  BMI 24.68 kg/m2  SpO2 99%    Assessment & Plan:  1. Allergic conjunctivitis, right - this is improving with OTC antihistamine eye drops and no s/s infection. Will have him continue OTC eye drops, Nasacort and can add long acting OTC antihistamine.  - RTC if he develops purulent drainage, pain, visual changes.   2. Axillary lymphadenopathy -  CBC - discussed possible causes, will monitor for 4 weeks, if not resolved will refer to general surgery  3. Need for vaccination for H flu type B - Flu Vaccine QUAD 36+ mos IM  4. Eczema - Provided written and verbal information regarding diagnosis and treatment. - betamethasone dipropionate (DIPROLENE) 0.05 % cream; Apply topically 2 (two) times daily. Apply sparingly for up to 10 days.  Dispense: 15 g;  Refill: 0 - follow up with dermatology as scheduled if not resolved  Olean Ree, FNP-BC  [facility]  11/27/2014 10:00 AM

## 2014-11-27 NOTE — Patient Instructions (Signed)
Continue antihistamine eye drops and lubricant drops for comfort Can add OTC long acting antihistamine like Zyrtec, Allegra, Claritin (generic fine) Continue Nasacort Let me know if your lymph node has not resolved in 4 weeks. Please get in touch sooner if more lymph nodes appear or you develop fever or rash.   Eczema Eczema, also called atopic dermatitis, is a skin disorder that causes inflammation of the skin. It causes a red rash and dry, scaly skin. The skin becomes very itchy. Eczema is generally worse during the cooler winter months and often improves with the warmth of summer. Eczema usually starts showing signs in infancy. Some children outgrow eczema, but it may last through adulthood.  CAUSES  The exact cause of eczema is not known, but it appears to run in families. People with eczema often have a family history of eczema, allergies, asthma, or hay fever. Eczema is not contagious. Flare-ups of the condition may be caused by:   Contact with something you are sensitive or allergic to.   Stress. SIGNS AND SYMPTOMS  Dry, scaly skin.   Red, itchy rash.   Itchiness. This may occur before the skin rash and may be very intense.  DIAGNOSIS  The diagnosis of eczema is usually made based on symptoms and medical history. TREATMENT  Eczema cannot be cured, but symptoms usually can be controlled with treatment and other strategies. A treatment plan might include:  Controlling the itching and scratching.   Use over-the-counter antihistamines as directed for itching. This is especially useful at night when the itching tends to be worse.   Use over-the-counter steroid creams as directed for itching.   Avoid scratching. Scratching makes the rash and itching worse. It may also result in a skin infection (impetigo) due to a break in the skin caused by scratching.   Keeping the skin well moisturized with creams every day. This will seal in moisture and help prevent dryness. Lotions  that contain alcohol and water should be avoided because they can dry the skin.   Limiting exposure to things that you are sensitive or allergic to (allergens).   Recognizing situations that cause stress.   Developing a plan to manage stress.  HOME CARE INSTRUCTIONS   Only take over-the-counter or prescription medicines as directed by your health care provider.   Do not use anything on the skin without checking with your health care provider.   Keep baths or showers short (5 minutes) in warm (not hot) water. Use mild cleansers for bathing. These should be unscented. You may add nonperfumed bath oil to the bath water. It is best to avoid soap and bubble bath.   Immediately after a bath or shower, when the skin is still damp, apply a moisturizing ointment to the entire body. This ointment should be a petroleum ointment. This will seal in moisture and help prevent dryness. The thicker the ointment, the better. These should be unscented.   Keep fingernails cut short. Children with eczema may need to wear soft gloves or mittens at night after applying an ointment.   Dress in clothes made of cotton or cotton blends. Dress lightly, because heat increases itching.   A child with eczema should stay away from anyone with fever blisters or cold sores. The virus that causes fever blisters (herpes simplex) can cause a serious skin infection in children with eczema. SEEK MEDICAL CARE IF:   Your itching interferes with sleep.   Your rash gets worse or is not better within 1 week after  starting treatment.   You see pus or soft yellow scabs in the rash area.   You have a fever.   You have a rash flare-up after contact with someone who has fever blisters.  Document Released: 03/14/2000 Document Revised: 01/05/2013 Document Reviewed: 10/18/2012 [facility] Patient Information 2015 [place], [place]. This information is not intended to replace advice given to you by your health care provider.  Make sure you discuss any questions you have with your health care provider. Allergic Conjunctivitis The conjunctiva is a thin membrane that covers the visible white part of the eyeball and the underside of the eyelids. This membrane protects and lubricates the eye. The membrane has small blood vessels running through it that can normally be seen. When the conjunctiva becomes inflamed, the condition is called conjunctivitis. In response to the inflammation, the conjunctival blood vessels become swollen. The swelling results in redness in the normally white part of the eye. The blood vessels of this membrane also react when a person has allergies and is then called allergic conjunctivitis. This condition usually lasts for as long as the allergy persists. Allergic conjunctivitis cannot be passed to another person (non-contagious). The likelihood of bacterial infection is great and the cause is not likely due to allergies if the inflamed eye has:  A sticky discharge.  Discharge or sticking together of the lids in the morning.  Scaling or flaking of the eyelids where the eyelashes come out.  Red swollen eyelids. CAUSES   Viruses.  Irritants such as foreign bodies.  Chemicals.  General allergic reactions.  Inflammation or serious diseases in the inside or the outside of the eye or the orbit (the boney cavity in which the eye sits) can cause a "red eye." SYMPTOMS   Eye redness.  Tearing.  Itchy eyes.  Burning feeling in the eyes.  Clear drainage from the eye.  Allergic reaction due to pollens or ragweed sensitivity. Seasonal allergic conjunctivitis is frequent in the spring when pollens are in the air and in the fall. DIAGNOSIS  This condition, in its many forms, is usually diagnosed based on the history and an ophthalmological exam. It usually involves both eyes. If your eyes react at the same time every year, allergies may be the cause. While most "red eyes" are due to allergy or an  infection, the role of an eye (ophthalmological) exam is important. The exam can rule out serious diseases of the eye or orbit. TREATMENT   Non-antibiotic eye drops, ointments, or medications by mouth may be prescribed if the ophthalmologist is sure the conjunctivitis is due to allergies alone.  Over-the-counter drops and ointments for allergic symptoms should be used only after other causes of conjunctivitis have been ruled out, or as your caregiver suggests. Medications by mouth are often prescribed if other allergy-related symptoms are present. If the ophthalmologist is sure that the conjunctivitis is due to allergies alone, treatment is normally limited to drops or ointments to reduce itching and burning. HOME CARE INSTRUCTIONS   Wash hands before and after applying drops or ointments, or touching the inflamed eye(s) or eyelids.  Do not let the eye dropper tip or ointment tube touch the eyelid when putting medicine in your eye.  Stop using your soft contact lenses and throw them away. Use a new pair of lenses when recovery is complete. You should run through sterilizing cycles at least three times before use after complete recovery if the old soft contact lenses are to be used. Hard contact lenses should be  stopped. They need to be thoroughly sterilized before use after recovery.  Itching and burning eyes due to allergies is often relieved by using a cool cloth applied to closed eye(s). SEEK MEDICAL CARE IF:   Your problems do not go away after two or three days of treatment.  Your lids are sticky (especially in the morning when you wake up) or stick together.  Discharge develops. Antibiotics may be needed either as drops, ointment, or by mouth.  You have extreme light sensitivity.  An oral temperature above 102 F (38.9 C) develops.  Pain in or around the eye or any other visual symptom develops. MAKE SURE YOU:   Understand these instructions.  Will watch your  condition.  Will get help right away if you are not doing well or get worse. Document Released: 06/07/2002 Document Revised: 06/09/2011 Document Reviewed: 05/03/2007 [facility] Patient Information 2015 [place], [place]. This information is not intended to replace advice given to you by your health care provider. Make sure you discuss any questions you have with your health care provider.

## 2014-11-29 ENCOUNTER — Encounter: Payer: Self-pay | Admitting: Family Medicine

## 2014-12-05 ENCOUNTER — Encounter: Payer: Self-pay | Admitting: Physician Assistant

## 2014-12-05 ENCOUNTER — Encounter: Payer: Self-pay | Admitting: Gastroenterology

## 2014-12-06 ENCOUNTER — Ambulatory Visit: Payer: BLUE CROSS/BLUE SHIELD | Admitting: Family Medicine

## 2014-12-13 ENCOUNTER — Ambulatory Visit (INDEPENDENT_AMBULATORY_CARE_PROVIDER_SITE_OTHER): Payer: BLUE CROSS/BLUE SHIELD | Admitting: Family

## 2014-12-13 ENCOUNTER — Encounter: Payer: Self-pay | Admitting: Family

## 2014-12-13 VITALS — BP 130/88 | HR 78 | Temp 98.1°F | Resp 18 | Ht 70.0 in | Wt 167.0 lb

## 2014-12-13 DIAGNOSIS — I889 Nonspecific lymphadenitis, unspecified: Secondary | ICD-10-CM | POA: Insufficient documentation

## 2014-12-13 NOTE — Assessment & Plan Note (Signed)
Symptoms and exam consistent with enlarged lymph note that is most likely benign. Previous CBC was normal. Continue to monitor for any growth or changes. If symptoms worsen will consider imaging and further evaluation.

## 2014-12-13 NOTE — Patient Instructions (Signed)
Thank you for choosing [facility].  Summary/Instructions:  Your prescription(s) have been submitted to your pharmacy or been printed and provided for you. Please take as directed and contact our office if you believe you are having problem(s) with the medication(s) or have any questions.  If your symptoms worsen or fail to improve, please contact our office for further instruction, or in case of emergency go directly to the emergency room at the closest medical facility.   Swollen Lymph Nodes The lymphatic system filters fluid from around cells. It is like a system of blood vessels. These channels carry lymph instead of blood. The lymphatic system is an important part of the immune (disease fighting) system. When people talk about "swollen glands in the neck," they are usually talking about swollen lymph nodes. The lymph nodes are like the little traps for infection. You and your caregiver may be able to feel lymph nodes, especially swollen nodes, in these common areas: the groin (inguinal area), armpits (axilla), and above the clavicle (supraclavicular). You may also feel them in the neck (cervical) and the back of the head just above the hairline (occipital). Swollen glands occur when there is any condition in which the body responds with an allergic type of reaction. For instance, the glands in the neck can become swollen from insect bites or any type of minor infection on the head. These are very noticeable in children with only minor problems. Lymph nodes may also become swollen when there is a tumor or problem with the lymphatic system, such as Hodgkin's disease. TREATMENT   Most swollen glands do not require treatment. They can be observed (watched) for a short period of time, if your caregiver feels it is necessary. Most of the time, observation is not necessary.  Antibiotics (medicines that kill germs) may be prescribed by your caregiver. Your caregiver may prescribe these if he or  she feels the swollen glands are due to a bacterial (germ) infection. Antibiotics are not used if the swollen glands are caused by a virus. HOME CARE INSTRUCTIONS   Take medications as directed by your caregiver. Only take over-the-counter or prescription medicines for pain, discomfort, or fever as directed by your caregiver. SEEK MEDICAL CARE IF:   If you begin to run a temperature greater than 102 F (38.9 C), or as your caregiver suggests. MAKE SURE YOU:   Understand these instructions.  Will watch your condition.  Will get help right away if you are not doing well or get worse. Document Released: 03/07/2002 Document Revised: 06/09/2011 Document Reviewed: 03/17/2005 [facility] Patient Information 2015 [place], [place]. This information is not intended to replace advice given to you by your health care provider. Make sure you discuss any questions you have with your health care provider.  Lymphadenopathy Lymphadenopathy means "disease of the lymph glands." But the term is usually used to describe swollen or enlarged lymph glands, also called lymph nodes. These are the bean-shaped organs found in many locations including the neck, underarm, and groin. Lymph glands are part of the immune system, which fights infections in your body. Lymphadenopathy can occur in just one area of the body, such as the neck, or it can be generalized, with lymph node enlargement in several areas. The nodes found in the neck are the most common sites of lymphadenopathy. CAUSES When your immune system responds to germs (such as viruses or bacteria ), infection-fighting cells and fluid build up. This causes the glands to grow in size. Usually, this is not something  to worry about. Sometimes, the glands themselves can become infected and inflamed. This is called lymphadenitis. Enlarged lymph nodes can be caused by many diseases:  Bacterial disease, such as strep throat or a skin infection.  Viral disease, such as a  common cold.  Other germs, such as Lyme disease, tuberculosis, or sexually transmitted diseases.  Cancers, such as lymphoma (cancer of the lymphatic system) or leukemia (cancer of the white blood cells).  Inflammatory diseases such as lupus or rheumatoid arthritis.  Reactions to medications. Many of the diseases above are rare, but important. This is why you should see your caregiver if you have lymphadenopathy. SYMPTOMS  Swollen, enlarged lumps in the neck, back of the head, or other locations.  Tenderness.  Warmth or redness of the skin over the lymph nodes.  Fever. DIAGNOSIS Enlarged lymph nodes are often near the source of infection. They can help health care providers diagnose your illness. For instance:  Swollen lymph nodes around the jaw might be caused by an infection in the mouth.  Enlarged glands in the neck often signal a throat infection.  Lymph nodes that are swollen in more than one area often indicate an illness caused by a virus. Your caregiver will likely know what is causing your lymphadenopathy after listening to your history and examining you. Blood tests, x-rays, or other tests may be needed. If the cause of the enlarged lymph node cannot be found, and it does not go away by itself, then a biopsy may be needed. Your caregiver will discuss this with you. TREATMENT Treatment for your enlarged lymph nodes will depend on the cause. Many times the nodes will shrink to normal size by themselves, with no treatment. Antibiotics or other medicines may be needed for infection. Only take over-the-counter or prescription medicines for pain, discomfort, or fever as directed by your caregiver. HOME CARE INSTRUCTIONS Swollen lymph glands usually return to normal when the underlying medical condition goes away. If they persist, contact your health-care provider. He/she might prescribe antibiotics or other treatments, depending on the diagnosis. Take any medications exactly as  prescribed. Keep any follow-up appointments made to check on the condition of your enlarged nodes. SEEK MEDICAL CARE IF:  Swelling lasts for more than two weeks.  You have symptoms such as weight loss, night sweats, fatigue, or fever that does not go away.  The lymph nodes are hard, seem fixed to the skin, or are growing rapidly.  Skin over the lymph nodes is red and inflamed. This could mean there is an infection. SEEK IMMEDIATE MEDICAL CARE IF:  Fluid starts leaking from the area of the enlarged lymph node.  You develop a fever of 102 F (38.9 C) or greater.  Severe pain develops (not necessarily at the site of a large lymph node).  You develop chest pain or shortness of breath.  You develop worsening abdominal pain. MAKE SURE YOU:  Understand these instructions.  Will watch your condition.  Will get help right away if you are not doing well or get worse. Document Released: 12/25/2007 Document Revised: 08/01/2013 Document Reviewed: 12/25/2007 [facility] Patient Information 2015 [place], [place]. This information is not intended to replace advice given to you by your health care provider. Make sure you discuss any questions you have with your health care provider.

## 2014-12-13 NOTE — Progress Notes (Signed)
Pre visit review using our clinic review tool, if applicable. No additional management support is needed unless otherwise documented below in the visit note.   Refused tetanus

## 2014-12-13 NOTE — Progress Notes (Signed)
Subjective:    Patient ID: Billy Phillips, male    DOB: 1980/06/03, 34 y.o.   MRN: [id number]  Chief Complaint  Patient presents with  . Establish Care    has question about lymph notes    HPI:  Billy Phillips is a 34 y.o. male with a PMH of IBS and Sullivan Lone syndrome who presents today for an office visit to establish care.  1.) Lymph node - Associated symptom of discomfort described as feeling like a pulled muscle located about his right groin has been going on for several pain. Was checked during a physical and no significant findings were noted. Modifying factors include stretching and ice have helped with the discomfort  Notes that the symptoms are waxing and waning. Within the last 2 months he has noted a small enlarged lymph node as well. Denies any sickness or illness. Does perform testicular self exam without any masses noted. Denies trauma to the area.   No Known Allergies   Outpatient Prescriptions Prior to Visit  Medication Sig Dispense Refill  . Multiple Vitamin (MULTIVITAMIN) tablet Take 1 tablet by mouth every morning.     . Omega-3 Fatty Acids (FISH OIL CONCENTRATE PO) Take by mouth.    . betamethasone dipropionate (DIPROLENE) 0.05 % cream Apply topically 2 (two) times daily. Apply sparingly for up to 10 days. 15 g 0   No facility-administered medications prior to visit.     Past Medical History  Diagnosis Date  . IBS (irritable bowel syndrome)   . Gilbert syndrome   . Hemorrhoid      Past Surgical History  Procedure Laterality Date  . Cholecystectomy N/A 08/23/2014    Procedure: LAPAROSCOPIC CHOLECYSTECTOMY WITH INTRAOPERATIVE CHOLANGIOGRAM ;  Surgeon: Claud Kelp, MD;  Location: [facility];  Service: General;  Laterality: N/A;  . Wisdom tooth extraction       Family History  Problem Relation Age of Onset  . Hyperlipidemia Father   . Hypertension Father   . Hyperlipidemia Sister   . Cancer Maternal Grandmother   . Hyperlipidemia Paternal  Grandmother   . Diabetes Paternal Grandfather      Social History   Social History  . Marital Status: Married    Spouse Name: [name]  . Number of Children: 2  . Years of Education: 16   Occupational History  . Communications    Social History Main Topics  . Smoking status: Never Smoker   . Smokeless tobacco: Never Used  . Alcohol Use: 0.0 oz/week    0 Standard drinks or equivalent per week     Comment: rare  . Drug Use: No  . Sexual Activity:    Partners: Female   Other Topics Concern  . Not on file   Social History Narrative   Fun: Workout, hang out with the children   Denies religious beliefs effecting health care.      Review of Systems  Constitutional: Negative for fever and chills.  Gastrointestinal: Negative for nausea, vomiting, abdominal pain, diarrhea and constipation.  Genitourinary: Negative for urgency, hematuria, decreased urine volume, discharge, penile swelling, scrotal swelling, penile pain and testicular pain.      Objective:    BP 130/88 mmHg  Pulse 78  Temp(Src) 98.1 F (36.7 C) (Oral)  Resp 18  Ht 5\' 10"  (1.778 m)  Wt 167 lb (75.751 kg)  BMI 23.96 kg/m2  SpO2 97% Nursing note and vital signs reviewed.  Physical Exam  Constitutional: He is oriented to person, place, and time. He appears  well-developed and well-nourished. No distress.  Cardiovascular: Normal rate, regular rhythm, normal heart sounds and intact distal pulses.   Pulmonary/Chest: Effort normal and breath sounds normal.  Abdominal: There is no tenderness. No hernia. Hernia confirmed negative in the ventral area and confirmed negative in the left inguinal area.  Lymphadenopathy:  Small right inguinal lymph noted that is soft, mobile and nontender.   Neurological: He is alert and oriented to person, place, and time.  Skin: Skin is warm and dry.  Psychiatric: He has a normal mood and affect. His behavior is normal. Judgment and thought content normal.       Assessment &  Plan:   Problem List Items Addressed This Visit      Immune and Lymphatic   Inguinal lymphadenitis - Primary    Symptoms and exam consistent with enlarged lymph note that is most likely benign. Previous CBC was normal. Continue to monitor for any growth or changes. If symptoms worsen will consider imaging and further evaluation.

## 2014-12-14 ENCOUNTER — Ambulatory Visit: Payer: BLUE CROSS/BLUE SHIELD | Admitting: Family

## 2014-12-21 ENCOUNTER — Encounter: Payer: Self-pay | Admitting: Physician Assistant

## 2014-12-21 ENCOUNTER — Other Ambulatory Visit (INDEPENDENT_AMBULATORY_CARE_PROVIDER_SITE_OTHER): Payer: BLUE CROSS/BLUE SHIELD

## 2014-12-21 ENCOUNTER — Ambulatory Visit (INDEPENDENT_AMBULATORY_CARE_PROVIDER_SITE_OTHER): Payer: BLUE CROSS/BLUE SHIELD | Admitting: Physician Assistant

## 2014-12-21 VITALS — BP 110/60 | HR 60 | Ht 70.0 in | Wt 174.2 lb

## 2014-12-21 DIAGNOSIS — R197 Diarrhea, unspecified: Secondary | ICD-10-CM | POA: Diagnosis not present

## 2014-12-21 DIAGNOSIS — R590 Localized enlarged lymph nodes: Secondary | ICD-10-CM

## 2014-12-21 DIAGNOSIS — R634 Abnormal weight loss: Secondary | ICD-10-CM

## 2014-12-21 DIAGNOSIS — R599 Enlarged lymph nodes, unspecified: Secondary | ICD-10-CM

## 2014-12-21 LAB — COMPREHENSIVE METABOLIC PANEL
ALT: 24 U/L (ref 0–53)
AST: 18 U/L (ref 0–37)
Albumin: 4.6 g/dL (ref 3.5–5.2)
Alkaline Phosphatase: 45 U/L (ref 39–117)
BUN: 16 mg/dL (ref 6–23)
CHLORIDE: 104 meq/L (ref 96–112)
CO2: 28 meq/L (ref 19–32)
Calcium: 9.5 mg/dL (ref 8.4–10.5)
Creatinine, Ser: 1.09 mg/dL (ref 0.40–1.50)
GFR: 82.45 mL/min (ref >60.00)
GLUCOSE: 90 mg/dL (ref 70–99)
POTASSIUM: 4.1 meq/L (ref 3.5–5.1)
Sodium: 139 mEq/L (ref 135–145)
Total Bilirubin: 1.9 mg/dL — ABNORMAL HIGH (ref 0.2–1.2)
Total Protein: 6.9 g/dL (ref 6.0–8.3)

## 2014-12-21 LAB — HIGH SENSITIVITY CRP: CRP, High Sensitivity: 0.67 mg/L (ref 0.000–5.000)

## 2014-12-21 LAB — SEDIMENTATION RATE: Sed Rate: 2 mm/hr (ref 0–22)

## 2014-12-21 LAB — TSH: TSH: 1.41 u[IU]/mL (ref 0.35–4.50)

## 2014-12-21 NOTE — Progress Notes (Signed)
Patient ID: Billy Phillips, male   DOB: [birth year], 34 y.o.   MRN: [id number]   Subjective:    Patient ID: Billy Phillips, male    DOB: 17-Jan-1981, 34 y.o.   MRN: [id number]  HPI [name] is a pleasant 34 year old white male, new to GI today referred by urgent care/Dr. Everlene Farrier for evaluation of complaint of loose stool and weight loss. Patient had undergone laparoscopic cholecystectomy in May 2016 and says that he did loose some weight prior to his surgery because of gallbladder symptoms and then had diarrhea for couple of weeks after surgery which she expected. This resolved and he began feeling better but says that he lost 10-15 pounds around the time of his surgery. Again he did well for a month or so and then about 3-4 weeks ago developed recurrent diarrhea which she says was present for about 3 weeks this was not severe but had very loose stools a couple of times per day every day for about 3 weeks. He had some mild lower abdominal cramping associated. He says his appetite was fine and he felt like he was eating a normal amount but lost about 8 pounds. Not noted any melena or hematochezia. No fever chills or sweats. He had been seen by his PCP had a CBC done on 829 WBC of 8.5 hemoglobin of 16, no imaging was done. He also expressed concern because he had had some palpable lymph nodes in his right groin and has now also noticed a lymph node in his right axillary area. He says those have been there for about 5 months. Appointment was made here and now over the past couple of days the diarrhea has resolved again till has occasional minimal cramping and otherwise has been feeling better. He has gained a couple of pounds. He states he weighed 188 last winter prior to onset of any of his gallbladder symptoms and weighs 174 now  Review of Systems Pertinent positive and negative review of systems were noted in the above HPI section.  All other review of systems was otherwise negative.  Outpatient  Encounter Prescriptions as of 12/21/2014  Medication Sig  . Multiple Vitamin (MULTIVITAMIN) tablet Take 1 tablet by mouth every morning.   . Omega-3 Fatty Acids (FISH OIL CONCENTRATE PO) Take by mouth.  . triamcinolone (NASACORT ALLERGY 24HR) 55 MCG/ACT AERO nasal inhaler Place 2 sprays into the nose daily.   No facility-administered encounter medications on file as of 12/21/2014.   No Known Allergies Patient Active Problem List   Diagnosis Date Noted  . Inguinal lymphadenitis 12/13/2014  . Cholelithiasis with chronic cholecystitis 08/23/2014  . Gilbert's syndrome 08/23/2014  . Abdominal pain, left upper quadrant 07/28/2014   Social History   Social History  . Marital Status: Married    Spouse Name: Anderson Malta  . Number of Children: 2  . Years of Education: 16   Occupational History  . Communications    Social History Main Topics  . Smoking status: Never Smoker   . Smokeless tobacco: Never Used  . Alcohol Use: 0.0 oz/week    0 Standard drinks or equivalent per week     Comment: rare  . Drug Use: No  . Sexual Activity:    Partners: Female   Other Topics Concern  . Not on file   Social History Narrative   Fun: Workout, hang out with the children   Denies religious beliefs effecting health care.     [name] family history includes Colon cancer in his maternal grandmother  and paternal grandfather; Colon polyps in his mother; Diabetes in his paternal grandfather; Hyperlipidemia in his father, paternal grandmother, and sister; Hypertension in his father.      Objective:    Filed Vitals:   12/21/14 0836  BP: 110/60  Pulse: 60    Physical Exam  well-developed white male in no acute distress, pleasant blood pressure 110/60 pulse 60 height 5 foot 10 weight 174, BMI 25. HEENT ;nontraumatic normocephalic EOMI PERRLA sclera anicteric, Supple ;no JVD she does have 2 small mobile rubbery lymph nodes palpable along the right submandibular area nontender, Cardiovascular  ;;regular rate and rhythm with S1-S2 murmur or gallop, Pulmonary; clear bilaterally, Abdomen;soft basically nontender there is no palpable mass or hepatosplenomegaly, did not examine inguinal lymph nodes" this had been done by his PCP about 2 weeks ago with finding of 2 small mobile right inguinal nodes, Rectal ;exam not done, Extremities; no clubbing cyanosis or edema skin warm and dry patient also has a 0.5 cm mobile lymph node palpable in the right axilla /upper arm., Psych; mood and affect appropriate       Assessment & Plan:   #3  34 yo male with intermittent diarrhea x 4 months- onset post lap chole 07/2014, also with weight loss of about 15 pounds since beginning of year,8 over the past 6 weeks, and right inguinal and right axillary adenopathy Etiology of above not clear- r/o underlying IBD, lymphoma or other intra abdominal inflammatory process. He may have IBS or post cholecystectomy bile salt induced diarrhea but neither generally associated with weight loss .  Plan; ESR,CRP, celiac markers, TSH,CMET Ct abd/pelvis  further plans pending above    Alfredia Ferguson PA-C 12/21/2014   Cc: Darlyne Russian, MD

## 2014-12-21 NOTE — Progress Notes (Signed)
Reviewed and agree with management plan.  Malcolm T. Stark, MD FACG 

## 2014-12-21 NOTE — Patient Instructions (Addendum)
Please go to the basement level to have your labs drawn.  If the diarrhea comes back, call us and we may order some stool studies.   You have been scheduled for a CT scan of the abdomen and pelvis at [facility] ([address]---this is in the same building as [facility]).   You are scheduled on 9-30-2-16 at 3:30 PM . You should arrive at 3:15  to your appointment time for registration. Please follow the written instructions below on the day of your exam:  WARNING: IF YOU ARE ALLERGIC TO IODINE/X-RAY DYE, PLEASE NOTIFY RADIOLOGY IMMEDIATELY AT [phone]! YOU WILL BE GIVEN A 13 HOUR PREMEDICATION PREP.  1) Do not eat or drink anything after 11:30 am  (4 hours prior to your test) 2) You have been given 2 bottles of oral contrast to drink. The solution may taste better if refrigerated, but do NOT add ice or any other liquid to this solution. Shake well before drinking.    Drink 1 bottle of contrast @ 1:30 PM  (2 hours prior to your exam)  Drink 1 bottle of contrast @ 2:30 PM  (1 hour prior to your exam)  You may take any medications as prescribed with a small amount of water except for the following: Metformin, Glucophage, Glucovance, Avandamet, Riomet, Fortamet, Actoplus Met, Janumet, Glumetza or Metaglip. The above medications must be held the day of the exam AND 48 hours after the exam.  The purpose of you drinking the oral contrast is to aid in the visualization of your intestinal tract. The contrast solution may cause some diarrhea. Before your exam is started, you will be given a small amount of fluid to drink. Depending on your individual set of symptoms, you may also receive an intravenous injection of x-ray contrast/dye. Plan on being at [facility] for 30 minutes or long, depending on the type of exam you are having performed.  If you have any questions regarding your exam or if you need to reschedule, you may call the CT department at [phone] between  the hours of 8:00 am and 5:00 pm, Monday-Friday.  ________________________________________________________________________

## 2014-12-22 LAB — CELIAC PANEL 10
Endomysial Screen: NEGATIVE
Gliadin IgA: 6 Units (ref <20)
Gliadin IgG: 8 Units (ref <20)
IgA: 67 mg/dL — ABNORMAL LOW (ref 68–379)
TISSUE TRANSGLUT AB: 1 U/mL (ref <6)
Tissue Transglutaminase Ab, IgA: 1 U/mL (ref <4)

## 2014-12-29 ENCOUNTER — Ambulatory Visit (INDEPENDENT_AMBULATORY_CARE_PROVIDER_SITE_OTHER)
Admission: RE | Admit: 2014-12-29 | Discharge: 2014-12-29 | Disposition: A | Discharge status: Home / Self Care | Payer: BLUE CROSS/BLUE SHIELD | Source: Ambulatory Visit | Attending: Physician Assistant | Admitting: Physician Assistant

## 2014-12-29 DIAGNOSIS — R197 Diarrhea, unspecified: Secondary | ICD-10-CM | POA: Diagnosis not present

## 2014-12-29 DIAGNOSIS — R599 Enlarged lymph nodes, unspecified: Secondary | ICD-10-CM | POA: Diagnosis not present

## 2014-12-29 DIAGNOSIS — R634 Abnormal weight loss: Secondary | ICD-10-CM | POA: Diagnosis not present

## 2014-12-29 DIAGNOSIS — R590 Localized enlarged lymph nodes: Secondary | ICD-10-CM

## 2014-12-29 MED ORDER — IOHEXOL 300 MG/ML  SOLN
100.0000 mL | Freq: Once | INTRAMUSCULAR | Status: AC | PRN
Start: 2014-12-29 — End: 2014-12-29
  Administered 2014-12-29: 100 mL via INTRAVENOUS

## 2015-01-02 ENCOUNTER — Telehealth: Payer: Self-pay | Admitting: Gastroenterology

## 2015-01-02 NOTE — Telephone Encounter (Signed)
I have left message for the patient. His CT results are not reviewed yet by his provider. I will let her know his is inquiring about them.

## 2015-01-02 NOTE — Telephone Encounter (Signed)
This pt saw Amy

## 2015-01-23 ENCOUNTER — Ambulatory Visit: Payer: BLUE CROSS/BLUE SHIELD | Admitting: Gastroenterology

## 2015-02-07 ENCOUNTER — Encounter: Payer: Self-pay | Admitting: Family

## 2015-02-13 ENCOUNTER — Telehealth: Payer: Self-pay | Admitting: Physician Assistant

## 2015-02-13 NOTE — Telephone Encounter (Signed)
Reports diarrhea for a week. Started as loose unformed stools and has progressed into watery diarrhea. No pain, fever or nausea. He has urgency without incontinence. Denies recent travel. Please advise. He would like to rule out infection.

## 2015-02-14 ENCOUNTER — Encounter: Payer: Self-pay | Admitting: Family

## 2015-02-14 ENCOUNTER — Ambulatory Visit (INDEPENDENT_AMBULATORY_CARE_PROVIDER_SITE_OTHER): Payer: BLUE CROSS/BLUE SHIELD | Admitting: Family

## 2015-02-14 ENCOUNTER — Other Ambulatory Visit: Payer: Self-pay

## 2015-02-14 VITALS — BP 132/88 | HR 78 | Temp 97.9°F | Resp 18 | Ht 70.0 in | Wt 179.0 lb

## 2015-02-14 DIAGNOSIS — R197 Diarrhea, unspecified: Secondary | ICD-10-CM

## 2015-02-14 DIAGNOSIS — J069 Acute upper respiratory infection, unspecified: Secondary | ICD-10-CM

## 2015-02-14 DIAGNOSIS — I889 Nonspecific lymphadenitis, unspecified: Secondary | ICD-10-CM

## 2015-02-14 MED ORDER — AZITHROMYCIN 250 MG PO TABS: ORAL_TABLET | ORAL | Status: DC

## 2015-02-14 MED ORDER — METHYLPREDNISOLONE ACETATE 80 MG/ML IJ SUSP
80.0000 mg | Freq: Once | INTRAMUSCULAR | Status: AC
Start: 2015-02-14 — End: 2015-02-14
  Administered 2015-02-14: 80 mg via INTRAMUSCULAR

## 2015-02-14 NOTE — Assessment & Plan Note (Signed)
Mild lymphadenitis noted on left anterior cervical chain which is most likely normal. Obtain ultrasound to rule out underlying pathology. Continue to monitor at this time and follow-up pending ultrasound results.

## 2015-02-14 NOTE — Telephone Encounter (Signed)
Can do a GI pathogen panel, and he can take Imodium 1-2  q 6-8 hours as needed for diarrhea

## 2015-02-14 NOTE — Progress Notes (Signed)
Pre visit review using our clinic review tool, if applicable. No additional management support is needed unless otherwise documented below in the visit note. 

## 2015-02-14 NOTE — Assessment & Plan Note (Signed)
Symptoms and exam consistent with acute upper respiratory infection most likely bacterial and cannot rule out underlying allergic rhinitis. In office injection Depo-Medrol provided. Start azithromycin. Continue over-the-counter medications as needed for symptom relief and supportive care. Follow-up if symptoms worsen or fail to improve.

## 2015-02-14 NOTE — Patient Instructions (Signed)
Thank you for choosing [facility].  Summary/Instructions:  Your prescription(s) have been submitted to your pharmacy or been printed and provided for you. Please take as directed and contact our office if you believe you are having problem(s) with the medication(s) or have any questions.  If your symptoms worsen or fail to improve, please contact our office for further instruction, or in case of emergency go directly to the emergency room at the closest medical facility.    General Recommendations:    Please drink plenty of fluids.  Get plenty of rest   Sleep in humidified air  Use saline nasal sprays  Netti pot   OTC Medications:  Decongestants - helps relieve congestion   Flonase (generic fluticasone) or Nasacort (generic triamcinolone) - please make sure to use the "cross-over" technique at a 45 degree angle towards the opposite eye as opposed to straight up the nasal passageway.   Sudafed (generic pseudoephedrine - Note this is the one that is available behind the pharmacy counter); Products with phenylephrine (-PE) may also be used but is often not as effective as pseudoephedrine.   If you have HIGH BLOOD PRESSURE - Coricidin HBP; AVOID any product that is -D as this contains pseudoephedrine which may increase your blood pressure.  Afrin (oxymetazoline) every 6-8 hours for up to 3 days.   Allergies - helps relieve runny nose, itchy eyes and sneezing   Claritin (generic loratidine), Allegra (fexofenidine), or Zyrtec (generic cyrterizine) for runny nose. These medications should not cause drowsiness.  Note - Benadryl (generic diphenhydramine) may be used however may cause drowsiness  Cough -   Delsym or Robitussin (generic dextromethorphan)  Expectorants - helps loosen mucus to ease removal   Mucinex (generic guaifenesin) as directed on the package.  Headaches / General Aches   Tylenol (generic acetaminophen) - DO NOT EXCEED 3 grams (3,000 mg) in a 24  hour time period  Advil/Motrin (generic ibuprofen)   Sore Throat -   Salt water gargle   Chloraseptic (generic benzocaine) spray or lozenges / Sucrets (generic dyclonine)    Upper Respiratory Infection, Adult Most upper respiratory infections (URIs) are a viral infection of the air passages leading to the lungs. A URI affects the nose, throat, and upper air passages. The most common type of URI is nasopharyngitis and is typically referred to as "the common cold." URIs run their course and usually go away on their own. Most of the time, a URI does not require medical attention, but sometimes a bacterial infection in the upper airways can follow a viral infection. This is called a secondary infection. Sinus and middle ear infections are common types of secondary upper respiratory infections. Bacterial pneumonia can also complicate a URI. A URI can worsen asthma and chronic obstructive pulmonary disease (COPD). Sometimes, these complications can require emergency medical care and may be life threatening.  CAUSES Almost all URIs are caused by viruses. A virus is a type of germ and can spread from one person to another.  RISKS FACTORS You may be at risk for a URI if:   You smoke.   You have chronic heart or lung disease.  You have a weakened defense (immune) system.   You are very young or very old.   You have nasal allergies or asthma.  You work in crowded or poorly ventilated areas.  You work in health care facilities or schools. SIGNS AND SYMPTOMS  Symptoms typically develop 2-3 days after you come in contact with a cold virus. Most   viral URIs last 7-10 days. However, viral URIs from the influenza virus (flu virus) can last 14-18 days and are typically more severe. Symptoms may include:   Runny or stuffy (congested) nose.   Sneezing.   Cough.   Sore throat.   Headache.   Fatigue.   Fever.   Loss of appetite.   Pain in your forehead, behind your eyes, and  over your cheekbones (sinus pain).  Muscle aches.  DIAGNOSIS  Your health care provider may diagnose a URI by:  Physical exam.  Tests to check that your symptoms are not due to another condition such as:  Strep throat.  Sinusitis.  Pneumonia.  Asthma. TREATMENT  A URI goes away on its own with time. It cannot be cured with medicines, but medicines may be prescribed or recommended to relieve symptoms. Medicines may help:  Reduce your fever.  Reduce your cough.  Relieve nasal congestion. HOME CARE INSTRUCTIONS   Take medicines only as directed by your health care provider.   Gargle warm saltwater or take cough drops to comfort your throat as directed by your health care provider.  Use a warm mist humidifier or inhale steam from a shower to increase air moisture. This may make it easier to breathe.  Drink enough fluid to keep your urine clear or pale yellow.   Eat soups and other clear broths and maintain good nutrition.   Rest as needed.   Return to work when your temperature has returned to normal or as your health care provider advises. You may need to stay home longer to avoid infecting others. You can also use a face mask and careful hand washing to prevent spread of the virus.  Increase the usage of your inhaler if you have asthma.   Do not use any tobacco products, including cigarettes, chewing tobacco, or electronic cigarettes. If you need help quitting, ask your health care provider. PREVENTION  The best way to protect yourself from getting a cold is to practice good hygiene.   Avoid oral or hand contact with people with cold symptoms.   Wash your hands often if contact occurs.  There is no clear evidence that vitamin C, vitamin E, echinacea, or exercise reduces the chance of developing a cold. However, it is always recommended to get plenty of rest, exercise, and practice good nutrition.  SEEK MEDICAL CARE IF:   You are getting worse rather than  better.   Your symptoms are not controlled by medicine.   You have chills.  You have worsening shortness of breath.  You have brown or red mucus.  You have yellow or brown nasal discharge.  You have pain in your face, especially when you bend forward.  You have a fever.  You have swollen neck glands.  You have pain while swallowing.  You have white areas in the back of your throat. SEEK IMMEDIATE MEDICAL CARE IF:   You have severe or persistent:  Headache.  Ear pain.  Sinus pain.  Chest pain.  You have chronic lung disease and any of the following:  Wheezing.  Prolonged cough.  Coughing up blood.  A change in your usual mucus.  You have a stiff neck.  You have changes in your:  Vision.  Hearing.  Thinking.  Mood. MAKE SURE YOU:   Understand these instructions.  Will watch your condition.  Will get help right away if you are not doing well or get worse.   This information is not intended to replace advice   given to you by your health care provider. Make sure you discuss any questions you have with your health care provider.   Document Released: 09/10/2000 Document Revised: 08/01/2014 Document Reviewed: 06/22/2013 Elsevier Interactive Patient Education 2016 Elsevier Inc.  

## 2015-02-14 NOTE — Telephone Encounter (Signed)
Patient advised.

## 2015-02-14 NOTE — Progress Notes (Signed)
Subjective:    Patient ID: Billy Phillips, male    DOB: [birth year], 34 y.o.   MRN: [id number]  Chief Complaint  Patient presents with  . URI    x3 weeks, has had cold sxs that have been going on, has lymph nodes that he wants looked at, drainage    HPI:  Billy Phillips is a 34 y.o. male who  has a past medical history of IBS (irritable bowel syndrome); Gilbert syndrome; and Hemorrhoid. and presents today for an acute office visit.  1.) Congestion - Associated symptoms of congestion, occasional cough, sinus pressure and post-nasal drip has been going on for about 3 weeks. Modifying factors of the Netti pot and Mucinex which did help with his symptoms. Denies any recent antibiotic use.   2.) Lymph nodes - Associated symptom of swollen lymph node has been going on for just over 2 months. Notes enlargement on the left aspect of his neck. Denies any changes to feel or size that he can tell. Denies modifying factors or treatments that make it better or worse.   No Known Allergies   Current Outpatient Prescriptions on File Prior to Visit  Medication Sig Dispense Refill  . Omega-3 Fatty Acids (FISH OIL CONCENTRATE PO) Take by mouth.    . triamcinolone (NASACORT ALLERGY 24HR) 55 MCG/ACT AERO nasal inhaler Place 2 sprays into the nose daily.     No current facility-administered medications on file prior to visit.    Past Medical History  Diagnosis Date  . IBS (irritable bowel syndrome)   . Gilbert syndrome   . Hemorrhoid      Past Surgical History  Procedure Laterality Date  . Cholecystectomy N/A 08/23/2014    Procedure: LAPAROSCOPIC CHOLECYSTECTOMY WITH INTRAOPERATIVE CHOLANGIOGRAM ;  Surgeon: Claud Kelp, MD;  Location: [facility];  Service: General;  Laterality: N/A;  . Wisdom tooth extraction      Review of Systems  Constitutional: Negative for fever and chills.  HENT: Positive for congestion, postnasal drip and sinus pressure.        Positive for neck mass.    Respiratory: Positive for cough.       Objective:    BP 132/88 mmHg  Pulse 78  Temp(Src) 97.9 F (36.6 C) (Oral)  Resp 18  Ht  (1.778 m)  Wt 179 lb (81.194 kg)  BMI 25.68 kg/m2  SpO2 98% Nursing note and vital signs reviewed.  Physical Exam  Constitutional: He is oriented to person, place, and time. He appears well-developed and well-nourished. No distress.  HENT:  Right Ear: Hearing, tympanic membrane, external ear and ear canal normal.  Left Ear: Hearing, tympanic membrane, external ear and ear canal normal.  Nose: Right sinus exhibits no maxillary sinus tenderness and no frontal sinus tenderness. Left sinus exhibits no maxillary sinus tenderness and no frontal sinus tenderness.  Mouth/Throat: Uvula is midline, oropharynx is clear and moist and mucous membranes are normal.  Neck:    Cardiovascular: Normal rate, regular rhythm, normal heart sounds and intact distal pulses.   Pulmonary/Chest: Effort normal and breath sounds normal.  Neurological: He is alert and oriented to person, place, and time.  Skin: Skin is warm and dry.  Psychiatric: He has a normal mood and affect. His behavior is normal. Judgment and thought content normal.       Assessment & Plan:   Problem List Items Addressed This Visit      Respiratory   Acute upper respiratory infection - Primary    Symptoms and  exam consistent with acute upper respiratory infection most likely bacterial and cannot rule out underlying allergic rhinitis. In office injection Depo-Medrol provided. Start azithromycin. Continue over-the-counter medications as needed for symptom relief and supportive care. Follow-up if symptoms worsen or fail to improve.      Relevant Medications   azithromycin (ZITHROMAX) 250 MG tablet   methylPREDNISolone acetate (DEPO-MEDROL) injection 80 mg (Completed)     Immune and Lymphatic   Lymphadenitis    Mild lymphadenitis noted on left anterior cervical chain which is most likely normal.  Obtain ultrasound to rule out underlying pathology. Continue to monitor at this time and follow-up pending ultrasound results.      Relevant Orders   US Soft Tissue Head/Neck

## 2015-02-16 ENCOUNTER — Other Ambulatory Visit: Payer: BLUE CROSS/BLUE SHIELD

## 2015-02-16 DIAGNOSIS — R197 Diarrhea, unspecified: Secondary | ICD-10-CM

## 2015-02-19 ENCOUNTER — Encounter: Payer: Self-pay | Admitting: Family

## 2015-02-19 ENCOUNTER — Ambulatory Visit
Admission: RE | Admit: 2015-02-19 | Discharge: 2015-02-19 | Disposition: A | Discharge status: Home / Self Care | Payer: BLUE CROSS/BLUE SHIELD | Source: Ambulatory Visit | Attending: Family | Admitting: Family

## 2015-02-19 DIAGNOSIS — I889 Nonspecific lymphadenitis, unspecified: Secondary | ICD-10-CM

## 2015-02-21 ENCOUNTER — Telehealth: Payer: Self-pay | Admitting: Physician Assistant

## 2015-02-21 LAB — GASTROINTESTINAL PATHOGEN PANEL PCR
C. DIFFICILE TOX A/B, PCR: NOT DETECTED
CRYPTOSPORIDIUM, PCR: NOT DETECTED
Campylobacter, PCR: NOT DETECTED
E coli (ETEC) LT/ST PCR: NOT DETECTED
E coli (STEC) stx1/stx2, PCR: NOT DETECTED
E coli 0157, PCR: NOT DETECTED
Giardia lamblia, PCR: NOT DETECTED
Norovirus, PCR: NOT DETECTED
ROTAVIRUS, PCR: NOT DETECTED
Salmonella, PCR: NOT DETECTED
Shigella, PCR: NOT DETECTED

## 2015-02-21 NOTE — Telephone Encounter (Signed)
Patient advised that the stool studies have not been reviewed. He is aware the results are all negative.

## 2015-04-13 ENCOUNTER — Encounter: Payer: Self-pay | Admitting: Gastroenterology

## 2015-04-13 ENCOUNTER — Ambulatory Visit (INDEPENDENT_AMBULATORY_CARE_PROVIDER_SITE_OTHER): Payer: 59 | Admitting: Gastroenterology

## 2015-04-13 VITALS — BP 114/62 | HR 72 | Ht 69.75 in | Wt 179.0 lb

## 2015-04-13 DIAGNOSIS — R197 Diarrhea, unspecified: Secondary | ICD-10-CM | POA: Diagnosis not present

## 2015-04-13 DIAGNOSIS — K921 Melena: Secondary | ICD-10-CM | POA: Diagnosis not present

## 2015-04-13 DIAGNOSIS — R1032 Left lower quadrant pain: Secondary | ICD-10-CM | POA: Diagnosis not present

## 2015-04-13 MED ORDER — NA SULFATE-K SULFATE-MG SULF 17.5-3.13-1.6 GM/177ML PO SOLN: 1.0000 | Freq: Once | ORAL | Status: DC

## 2015-04-13 NOTE — Patient Instructions (Signed)
You have been scheduled for a colonoscopy. Please follow written instructions given to you at your visit today.  Please pick up your prep supplies at the pharmacy within the next 1-3 days. If you use inhalers (even only as needed), please bring them with you on the day of your procedure. Your physician has requested that you go to www.startemmi.com and enter the access code given to you at your visit today. This web site gives a general overview about your procedure. However, you should still follow specific instructions given to you by our office regarding your preparation for the procedure.  Thank you for choosing me and [facility].  Malcolm T. Stark, Jr., MD., FACG  

## 2015-04-13 NOTE — Progress Notes (Signed)
    History of Present Illness: This is a 35 year old male returning for follow-up of diarrhea and weight loss. He was evaluated by Mike GipAmy Esterwood in September. He had a history of a cholecystectomy several months ago with diarrhea following cholecystectomy and had about a 15 pound weight loss. He had an extensive evaluation including a CT scan which was negative, GI pathogen panel negative and blood work including celiac testing was unremarkable however his IgA was slightly low at 67 his indirect bilirubin was also slightly elevated. Over the past couple months his diarrhea has improved and he now has alternating constipation and diarrhea. He has not needed Imodium recently. He has noted several episodes of small amounts of bright red blood after wiping following bowel movements. He has noted crampy left lower quadrant pain intermittently. He has gained back all the way he had previously lost and his appetite is good.  Current Medications, Allergies, Past Medical History, Past Surgical History, Family History and Social History were reviewed in [facility] electronic medical record.  Physical Exam: General: Well developed, well nourished, no acute distress Head: Normocephalic and atraumatic Eyes:  sclerae anicteric, EOMI Ears: Normal auditory acuity Mouth: No deformity or lesions Lungs: Clear throughout to auscultation Heart: Regular rate and rhythm; no murmurs, rubs or bruits Abdomen: Soft, non tender and non distended. No masses, hepatosplenomegaly or hernias noted. Normal Bowel sounds Rectal: deferred to colonoscopy Musculoskeletal: Symmetrical with no gross deformities  Pulses:  Normal pulses noted Extremities: No clubbing, cyanosis, edema or deformities noted Neurological: Alert oriented x 4, grossly nonfocal Psychological:  Alert and cooperative. Normal mood and affect  Assessment and Recommendations:  1. Resolving diarrhea. Weight loss resolved. His bowel pattern is not back to his  baseline and he has complaints of rectal bleeding and left lower quadrant crampy pain. Rule out IBD and colorectal neoplasms. I suspect he had postcholecystectomy diarrhea and has hemorrhoidal bleeding. Although his IgA was low it is unlikely he has celiac disease as his diarrhea has resolved and his weight has returned to normal. Schedule colonoscopy. The risks (including bleeding, perforation, infection, missed lesions, medication reactions and possible hospitalization or surgery if complications occur), benefits, and alternatives to colonoscopy with possible biopsy and possible polypectomy were discussed with the patient and they consent to proceed.

## 2015-05-11 ENCOUNTER — Ambulatory Visit (AMBULATORY_SURGERY_CENTER): Payer: 59 | Admitting: Gastroenterology

## 2015-05-11 ENCOUNTER — Encounter: Payer: Self-pay | Admitting: Gastroenterology

## 2015-05-11 VITALS — BP 119/72 | HR 72 | Temp 98.0°F | Resp 21 | Ht 69.75 in | Wt 179.0 lb

## 2015-05-11 DIAGNOSIS — K921 Melena: Secondary | ICD-10-CM

## 2015-05-11 DIAGNOSIS — R194 Change in bowel habit: Secondary | ICD-10-CM | POA: Diagnosis not present

## 2015-05-11 MED ORDER — DICYCLOMINE HCL 10 MG PO CAPS: 10.0000 mg | ORAL_CAPSULE | Freq: Three times a day (TID) | ORAL | Status: DC

## 2015-05-11 MED ORDER — SODIUM CHLORIDE 0.9 % IV SOLN: 500.0000 mL | INTRAVENOUS | Status: DC

## 2015-05-11 NOTE — Patient Instructions (Signed)
YOU HAD AN ENDOSCOPIC PROCEDURE TODAY AT THE [facility]:   Refer to the procedure report that was given to you for any specific questions about what was found during the examination.  If the procedure report does not answer your questions, please call your gastroenterologist to clarify.  If you requested that your care partner not be given the details of your procedure findings, then the procedure report has been included in a sealed envelope for you to review at your convenience later.  YOU SHOULD EXPECT: Some feelings of bloating in the abdomen. Passage of more gas than usual.  Walking can help get rid of the air that was put into your GI tract during the procedure and reduce the bloating. If you had a lower endoscopy (such as a colonoscopy or flexible sigmoidoscopy) you may notice spotting of blood in your stool or on the toilet paper. If you underwent a bowel prep for your procedure, you may not have a normal bowel movement for a few days.  Please Note:  You might notice some irritation and congestion in your nose or some drainage.  This is from the oxygen used during your procedure.  There is no need for concern and it should clear up in a day or so.  SYMPTOMS TO REPORT IMMEDIATELY:   Following lower endoscopy (colonoscopy or flexible sigmoidoscopy):  Excessive amounts of blood in the stool  Significant tenderness or worsening of abdominal pains  Swelling of the abdomen that is new, acute  Fever of 100F or higher  a gastroenterologist can be reached at any hour by calling [phone].   DIET: Your first meal following the procedure should be a small meal and then it is ok to progress to your normal diet. Heavy or fried foods are harder to digest and may make you feel nauseous or bloated.  Likewise, meals heavy in dairy and vegetables can increase bloating.  Drink plenty of fluids but you should avoid alcoholic beverages for 24 hours.  ACTIVITY:  You should plan to take it  easy for the rest of today and you should NOT DRIVE or use heavy machinery until tomorrow (because of the sedation medicines used during the test).    FOLLOW UP: Our staff will call the number listed on your records the next business day following your procedure to check on you and address any questions or concerns that you may have regarding the information given to you following your procedure. If we do not reach you, we will leave a message.  However, if you are feeling well and you are not experiencing any problems, there is no need to return our call.  We will assume that you have returned to your regular daily activities without incident.  If any biopsies were taken you will be contacted by phone or by letter within the next 1-3 weeks.  Please call us at [phone] if you have not heard about the biopsies in 3 weeks.    SIGNATURES/CONFIDENTIALITY: You and/or your care partner have signed paperwork which will be entered into your electronic medical record.  These signatures attest to the fact that that the information above on your After Visit Summary has been reviewed and is understood.  Full responsibility of the confidentiality of this discharge information lies with you and/or your care-partner.  Normal screening Take Dicyclomine 10 mg tab three times daily as needed

## 2015-05-11 NOTE — Op Note (Signed)
[facility] [address] [place], [address]   COLONOSCOPY PROCEDURE REPORT  PATIENT: Billy Phillips, Billy Phillips  MR#: [id number] BIRTHDATE: 1980-09-13 , 35  yrs. old GENDER: male ENDOSCOPIST: Meryl Dare, MD, [facility] REFERRED BY:  Jeanine Luz, NP PROCEDURE DATE:  05/11/2015 PROCEDURE:   Colonoscopy, diagnostic First Screening Colonoscopy - Avg.  risk and is 50 yrs.  old or older - No.  Prior Negative Screening - Now for repeat screening. N/A  History of Adenoma - Now for follow-up colonoscopy & has been > or = to 3 yrs.  N/A  Polyps removed today? No Recommend repeat exam, <10 yrs? No ASA CLASS:   Class II INDICATIONS:Evaluation of unexplained GI bleeding. MEDICATIONS: Monitored anesthesia care and Propofol 250 mg IV  DESCRIPTION OF PROCEDURE:   After the risks benefits and alternatives of the procedure were thoroughly explained, informed consent was obtained.  The digital rectal exam revealed no abnormalities of the rectum.   The LB [id number]-[id number]  endoscope was introduced through the anus and advanced to the cecum, which was identified by both the appendix and ileocecal valve. No adverse events experienced.   The quality of the prep was excellent. (Suprep was used)  The instrument was then slowly withdrawn as the colon was fully examined. Estimated blood loss is zero unless otherwise noted in this procedure report.    COLON FINDINGS: A normal appearing cecum, ileocecal valve, and appendiceal orifice were identified.  The ascending, transverse, descending, sigmoid colon, and rectum appeared unremarkable. Retroflexed views revealed no abnormalities. The time to cecum = 1.7 Withdrawal time = 9.6   The scope was withdrawn and the procedure completed. COMPLICATIONS: There were no immediate complications.  ENDOSCOPIC IMPRESSION: Normal colonoscopy  RECOMMENDATIONS: Dicyclomine 10 mg po tid prn abd pain  eSigned:  Meryl Dare, MD, [facility] 05/11/2015  3:58 PM

## 2015-05-11 NOTE — Progress Notes (Signed)
To pacu vss patent aw report to rn 

## 2015-05-14 ENCOUNTER — Telehealth: Payer: Self-pay | Admitting: *Deleted

## 2015-05-14 NOTE — Telephone Encounter (Signed)
Name identifier, left message, follow-up 

## 2015-08-08 ENCOUNTER — Encounter: Payer: Self-pay | Admitting: Gastroenterology

## 2015-10-23 ENCOUNTER — Ambulatory Visit (INDEPENDENT_AMBULATORY_CARE_PROVIDER_SITE_OTHER): Payer: 59 | Admitting: Family

## 2015-10-23 ENCOUNTER — Encounter: Payer: Self-pay | Admitting: Family

## 2015-10-23 DIAGNOSIS — A09 Infectious gastroenteritis and colitis, unspecified: Secondary | ICD-10-CM | POA: Diagnosis not present

## 2015-10-23 MED ORDER — CIPROFLOXACIN HCL 500 MG PO TABS: 500.0000 mg | ORAL_TABLET | Freq: Two times a day (BID) | ORAL | 0 refills | Status: DC

## 2015-10-23 NOTE — Assessment & Plan Note (Signed)
Symptoms and exam consistent with traveler's diarrhea. Start ciprofloxacin. May continue Imodium as necessary. If symptoms worsen or do not improve consider stool culture and ova/parasites testing.

## 2015-10-23 NOTE — Progress Notes (Signed)
Subjective:    Patient ID: Billy Phillips, male    DOB: [birth year], 35 y.o.   MRN: [id number]  Chief Complaint  Patient presents with  . Stomach issues    went out of state to [place], since he has been back has had some stomach cramping and feels like everything has went straight through him, does IBS but states this feels different than what he is used to with IBS sxs    HPI:  Billy Phillips is a 35 y.o. male who  has a past medical history of Gilbert syndrome; Hemorrhoid; and IBS (irritable bowel syndrome). and presents today for an acute office visit.   This is a new problem. Associated symptom of stomach cramping and diarrhea have been going on for a couple of weeks following return from the [place]. Modifying factors include immodium which seemed to help a little. Continues to experience off and on diarrhea with up to 10 bowel movements. No fevers. Drinking lots of fluids.   No Known Allergies   Current Outpatient Prescriptions on File Prior to Visit  Medication Sig Dispense Refill  . Multiple Vitamin (MULTIVITAMIN) tablet Take 1 tablet by mouth as needed.    . Omega-3 Fatty Acids (FISH OIL CONCENTRATE PO) Take by mouth as needed.     . pseudoephedrine (SUDAFED) 60 MG tablet Take 60 mg by mouth every 4 (four) hours as needed for congestion.    . triamcinolone (NASACORT ALLERGY 24HR) 55 MCG/ACT AERO nasal inhaler Place 2 sprays into the nose as needed.      No current facility-administered medications on file prior to visit.      Past Surgical History:  Procedure Laterality Date  . CHOLECYSTECTOMY N/A 08/23/2014   Procedure: LAPAROSCOPIC CHOLECYSTECTOMY WITH INTRAOPERATIVE CHOLANGIOGRAM ;  Surgeon: Claud Kelp, MD;  Location: [facility];  Service: General;  Laterality: N/A;  . WISDOM TOOTH EXTRACTION      Past Medical History:  Diagnosis Date  . Gilbert syndrome   . Hemorrhoid   . IBS (irritable bowel syndrome)      Review of Systems    Constitutional: Negative for chills and fever.  Gastrointestinal: Positive for diarrhea. Negative for abdominal pain, nausea and vomiting.      Objective:    BP 124/84 (BP Location: Left Arm, Patient Position: Sitting, Cuff Size: Normal)   Pulse 73   Temp 98.1 F (36.7 C) (Oral)   Resp 16   Ht 5\' 10"  (1.778 m)   Wt 191 lb (86.6 kg)   SpO2 98%   BMI 27.41 kg/m  Nursing note and vital signs reviewed.  Physical Exam  Constitutional: He is oriented to person, place, and time. He appears well-developed and well-nourished. No distress.  Cardiovascular: Normal rate, regular rhythm, normal heart sounds and intact distal pulses.   Pulmonary/Chest: Effort normal and breath sounds normal.  Abdominal: Normal appearance and bowel sounds are normal. He exhibits no mass. There is no hepatosplenomegaly. There is no tenderness. There is no rigidity, no rebound, no guarding, no tenderness at McBurney's point and negative Murphy's sign.  Neurological: He is alert and oriented to person, place, and time.  Skin: Skin is warm and dry.  Psychiatric: He has a normal mood and affect. His behavior is normal. Judgment and thought content normal.       Assessment & Plan:   Problem List Items Addressed This Visit      Digestive   Traveler's diarrhea    Symptoms and exam consistent with traveler's diarrhea.  Start ciprofloxacin. May continue Imodium as necessary. If symptoms worsen or do not improve consider stool culture and ova/parasites testing.      Relevant Medications   ciprofloxacin (CIPRO) 500 MG tablet    Other Visit Diagnoses   None.     I have discontinued Mr. Meidinger dicyclomine. I am also having him start on ciprofloxacin. Additionally, I am having him maintain his Omega-3 Fatty Acids (FISH OIL CONCENTRATE PO), triamcinolone, multivitamin, and pseudoephedrine.   Follow-up: Return if symptoms worsen or fail to improve.  Jeanine Luz, FNP

## 2015-10-23 NOTE — Patient Instructions (Signed)
Thank you for choosing [facility].  Summary/Instructions:  Your prescription(s) have been submitted to your pharmacy or been printed and provided for you. Please take as directed and contact our office if you believe you are having problem(s) with the medication(s) or have any questions.  If your symptoms worsen or fail to improve, please contact our office for further instruction, or in case of emergency go directly to the emergency room at the closest medical facility.    Diarrhea Diarrhea is frequent loose and watery bowel movements. It can cause you to feel weak and dehydrated. Dehydration can cause you to become tired and thirsty, have a dry mouth, and have decreased urination that often is dark yellow. Diarrhea is a sign of another problem, most often an infection that will not last long. In most cases, diarrhea typically lasts 2-3 days. However, it can last longer if it is a sign of something more serious. It is important to treat your diarrhea as directed by your caregiver to lessen or prevent future episodes of diarrhea. CAUSES  Some common causes include:  Gastrointestinal infections caused by viruses, bacteria, or parasites.  Food poisoning or food allergies.  Certain medicines, such as antibiotics, chemotherapy, and laxatives.  Artificial sweeteners and fructose.  Digestive disorders. HOME CARE INSTRUCTIONS  Ensure adequate fluid intake (hydration): Have 1 cup (8 oz) of fluid for each diarrhea episode. Avoid fluids that contain simple sugars or sports drinks, fruit juices, whole milk products, and sodas. Your urine should be clear or pale yellow if you are drinking enough fluids. Hydrate with an oral rehydration solution that you can purchase at pharmacies, retail stores, and online. You can prepare an oral rehydration solution at home by mixing the following ingredients together:   - tsp table salt.   tsp baking soda.   tsp salt substitute containing potassium  chloride.  1  tablespoons sugar.  1 L (34 oz) of water.  Certain foods and beverages may increase the speed at which food moves through the gastrointestinal (GI) tract. These foods and beverages should be avoided and include:  Caffeinated and alcoholic beverages.  High-fiber foods, such as raw fruits and vegetables, nuts, seeds, and whole grain breads and cereals.  Foods and beverages sweetened with sugar alcohols, such as xylitol, sorbitol, and mannitol.  Some foods may be well tolerated and may help thicken stool including:  Starchy foods, such as rice, toast, pasta, low-sugar cereal, oatmeal, grits, baked potatoes, crackers, and bagels.  Bananas.  Applesauce.  Add probiotic-rich foods to help increase healthy bacteria in the GI tract, such as yogurt and fermented milk products.  Wash your hands well after each diarrhea episode.  Only take over-the-counter or prescription medicines as directed by your caregiver.  Take a warm bath to relieve any burning or pain from frequent diarrhea episodes. SEEK IMMEDIATE MEDICAL CARE IF:   You are unable to keep fluids down.  You have persistent vomiting.  You have blood in your stool, or your stools are black and tarry.  You do not urinate in 6-8 hours, or there is only a small amount of very dark urine.  You have abdominal pain that increases or localizes.  You have weakness, dizziness, confusion, or light-headedness.  You have a severe headache.  Your diarrhea gets worse or does not get better.  You have a fever or persistent symptoms for more than 2-3 days.  You have a fever and your symptoms suddenly get worse. MAKE SURE YOU:   Understand these instructions.  Will watch your condition.  Will get help right away if you are not doing well or get worse.   This information is not intended to replace advice given to you by your health care provider. Make sure you discuss any questions you have with your health care  provider.   Document Released: 03/07/2002 Document Revised: 04/07/2014 Document Reviewed: 11/23/2011 Elsevier Interactive Patient Education [employer]

## 2015-11-21 ENCOUNTER — Emergency Department (HOSPITAL_COMMUNITY)
Admission: EM | Admit: 2015-11-21 | Discharge: 2015-11-21 | Disposition: A | Discharge status: Home / Self Care | Payer: No Typology Code available for payment source | Attending: Emergency Medicine | Admitting: Emergency Medicine

## 2015-11-21 ENCOUNTER — Encounter (HOSPITAL_COMMUNITY): Payer: Self-pay | Admitting: Emergency Medicine

## 2015-11-21 DIAGNOSIS — Z791 Long term (current) use of non-steroidal anti-inflammatories (NSAID): Secondary | ICD-10-CM | POA: Insufficient documentation

## 2015-11-21 DIAGNOSIS — Y92410 Unspecified street and highway as the place of occurrence of the external cause: Secondary | ICD-10-CM | POA: Diagnosis not present

## 2015-11-21 DIAGNOSIS — Y9389 Activity, other specified: Secondary | ICD-10-CM | POA: Diagnosis not present

## 2015-11-21 DIAGNOSIS — Y990 Civilian activity done for income or pay: Secondary | ICD-10-CM | POA: Diagnosis not present

## 2015-11-21 DIAGNOSIS — S0990XA Unspecified injury of head, initial encounter: Secondary | ICD-10-CM | POA: Diagnosis present

## 2015-11-21 DIAGNOSIS — S060X0A Concussion without loss of consciousness, initial encounter: Secondary | ICD-10-CM | POA: Diagnosis not present

## 2015-11-21 MED ORDER — ORPHENADRINE CITRATE ER 100 MG PO TB12: 100.0000 mg | ORAL_TABLET | Freq: Two times a day (BID) | ORAL | 0 refills | Status: DC

## 2015-11-21 MED ORDER — NAPROXEN 500 MG PO TABS: 500.0000 mg | ORAL_TABLET | Freq: Two times a day (BID) | ORAL | 0 refills | Status: DC
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
## 2015-11-28 ENCOUNTER — Ambulatory Visit (INDEPENDENT_AMBULATORY_CARE_PROVIDER_SITE_OTHER): Payer: 59 | Admitting: Family

## 2015-11-28 ENCOUNTER — Encounter: Payer: Self-pay | Admitting: Family

## 2015-11-28 DIAGNOSIS — S060XAA Concussion with loss of consciousness status unknown, initial encounter: Secondary | ICD-10-CM | POA: Insufficient documentation

## 2015-11-28 DIAGNOSIS — S060X0D Concussion without loss of consciousness, subsequent encounter: Secondary | ICD-10-CM

## 2015-11-28 DIAGNOSIS — S060X9A Concussion with loss of consciousness of unspecified duration, initial encounter: Secondary | ICD-10-CM | POA: Insufficient documentation

## 2015-11-28 NOTE — Patient Instructions (Signed)
Thank you for choosing [facility].  SUMMARY AND INSTRUCTIONS:  You are looking good with no evidence of post-concussion at this time.   Increase your activity slowly and if you get a headache slow down.   Monitor for signs of increased headaches, dizziness, increased confusion.   Medication:  Continue to use OTC medications as needed for symptom relief.   Follow up:  If your symptoms worsen or fail to improve, please contact our office for further instruction, or in case of emergency go directly to the emergency room at the closest medical facility.    Post-Concussion Syndrome Post-concussion syndrome describes the symptoms that can occur after a head injury. These symptoms can last from weeks to months. CAUSES  It is not clear why some head injuries cause post-concussion syndrome. It can occur whether your head injury was mild or severe and whether you were wearing head protection or not.  SIGNS AND SYMPTOMS  Memory difficulties.  Dizziness.  Headaches.  Double vision or blurry vision.  Sensitivity to light.  Hearing difficulties.  Depression.  Tiredness.  Weakness.  Difficulty with concentration.  Difficulty sleeping or staying asleep.  Vomiting.  Poor balance or instability on your feet.  Slow reaction time.  Difficulty learning and remembering things you have heard. DIAGNOSIS  There is no test to determine whether you have post-concussion syndrome. Your health care provider may order an imaging scan of your brain, such as a CT scan, to check for other problems that may be causing your symptoms (such as a severe injury inside your skull). TREATMENT  Usually, these problems disappear over time without medical care. Your health care provider may prescribe medicine to help ease your symptoms. It is important to follow up with a neurologist to evaluate your recovery and address any lingering symptoms or issues. HOME CARE INSTRUCTIONS   Take medicines  only as directed by your health care provider. Do not take aspirin. Aspirin can slow blood clotting.  Sleep with your head slightly elevated to help with headaches.  Avoid any situation where there is potential for another head injury. This includes football, hockey, soccer, basketball, martial arts, downhill snow sports, and horseback riding. Your condition will get worse every time you experience a concussion. You should avoid these activities until you are evaluated by the appropriate follow-up health care providers.  Keep all follow-up visits as directed by your health care provider. This is important. SEEK MEDICAL CARE IF:  You have increased problems paying attention or concentrating.  You have increased difficulty remembering or learning new information.  You need more time to complete tasks or assignments than before.  You have increased irritability or decreased ability to cope with stress.  You have more symptoms than before. Seek medical care if you have any of the following symptoms for more than two weeks after your injury:  Lasting (chronic) headaches.  Dizziness or balance problems.  Nausea.  Vision problems.  Increased sensitivity to noise or light.  Depression or mood swings.  Anxiety or irritability.  Memory problems.  Difficulty concentrating or paying attention.  Sleep problems.  Feeling tired all the time. SEEK IMMEDIATE MEDICAL CARE IF:  You have confusion or unusual drowsiness.  Others find it difficult to wake you up.  You have nausea or persistent, forceful vomiting.  You feel like you are moving when you are not (vertigo). Your eyes may move rapidly back and forth.  You have convulsions or faint.  You have severe, persistent headaches that are not  relieved by medicine.  You cannot use your arms or legs normally.  One of your pupils is larger than the other.  You have clear or bloody discharge from your nose or ears.  Your problems  are getting worse, not better. MAKE SURE YOU:  Understand these instructions.  Will watch your condition.  Will get help right away if you are not doing well or get worse.   This information is not intended to replace advice given to you by your health care provider. Make sure you discuss any questions you have with your health care provider.   Document Released: 09/06/2001 Document Revised: 04/07/2014 Document Reviewed: 06/22/2013 Elsevier Interactive Patient Education [employer]2016 Elsevier Inc.
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
## 2015-11-28 NOTE — Assessment & Plan Note (Signed)
Symptoms and exam consistent with resolving mild concussion with no postconcussive symptoms noted at present. Neurological exam is benign with no headaches. Continue over-the-counter medications as needed for symptom relief. Increase activity gradually. Follow-up if symptoms return or worsen.

## 2016-04-18 IMAGING — CR DG ABDOMEN ACUTE W/ 1V CHEST
3 series · 3 of 3 positions shown · non-contrast
Comparison: None.

CLINICAL DATA: Intermittent abdominal pain started 1 week ago.
Right upper quadrant pain.

EXAM:
DG ABDOMEN ACUTE W/ 1V CHEST

[PA]
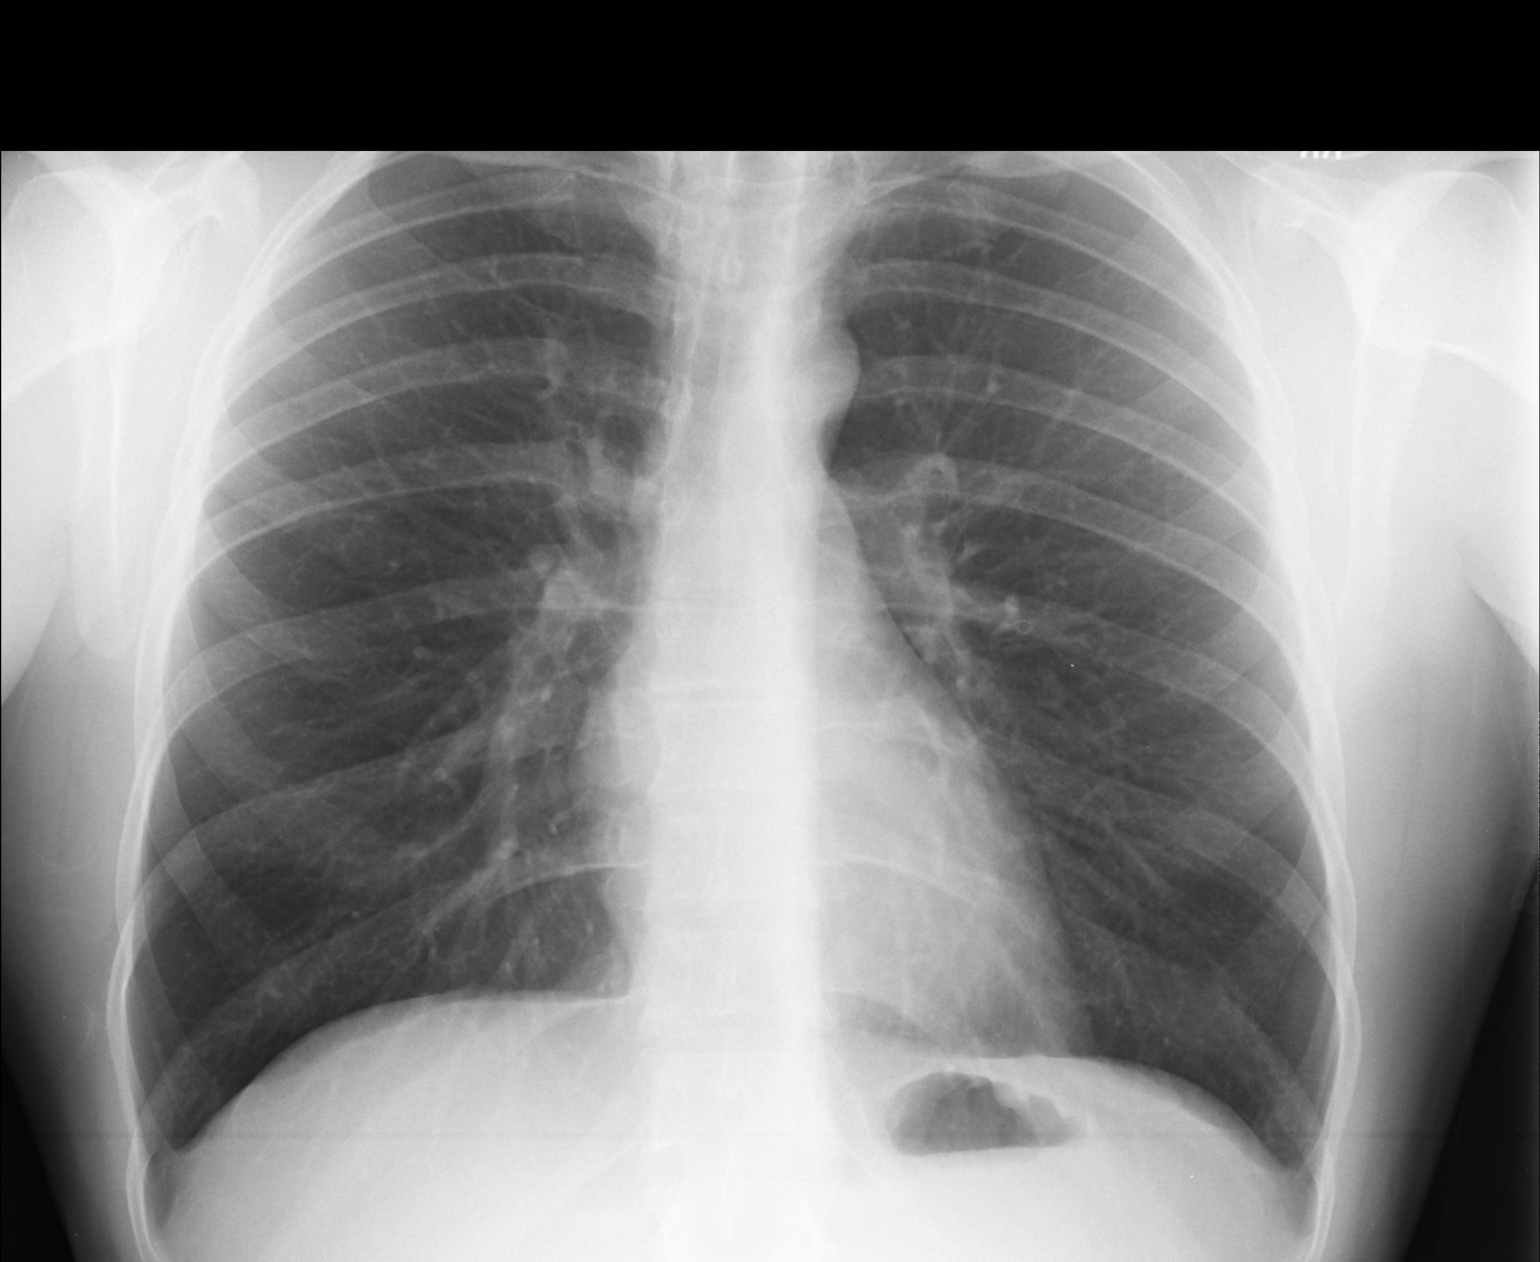

[AP (1 of 2)]
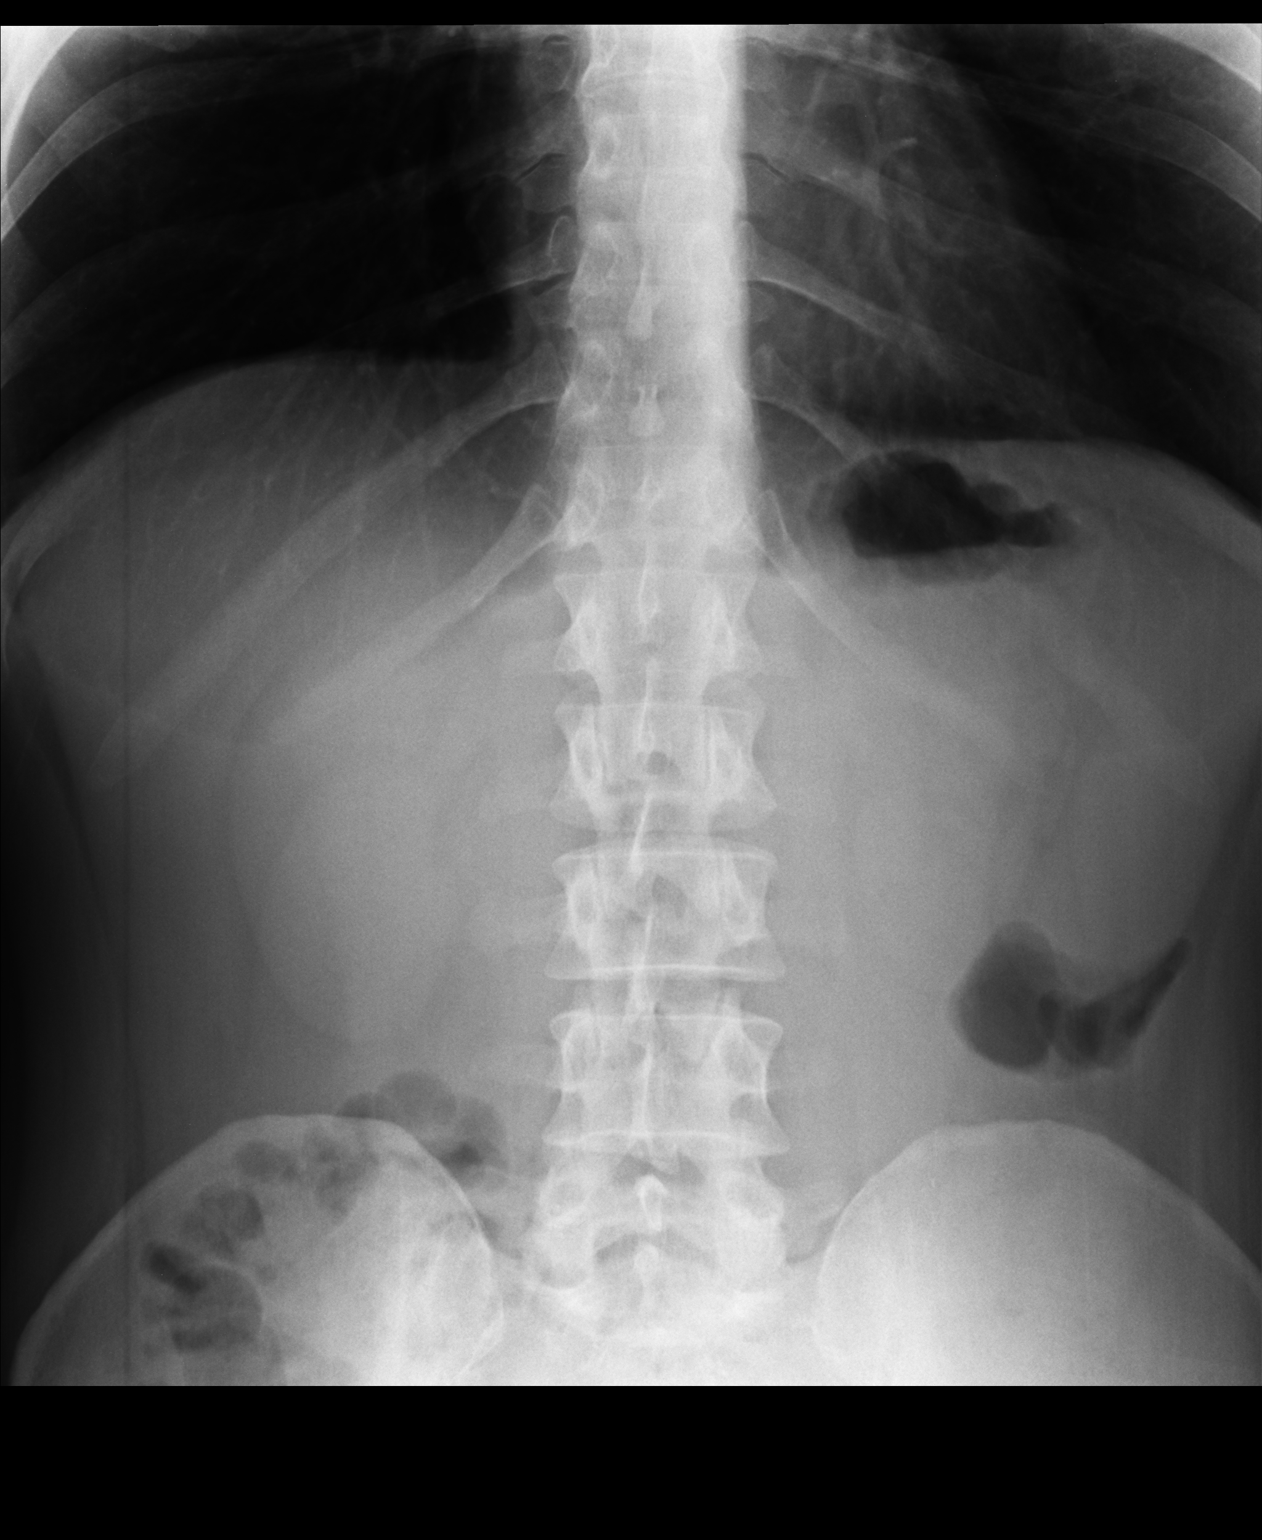

[AP (2 of 2)]
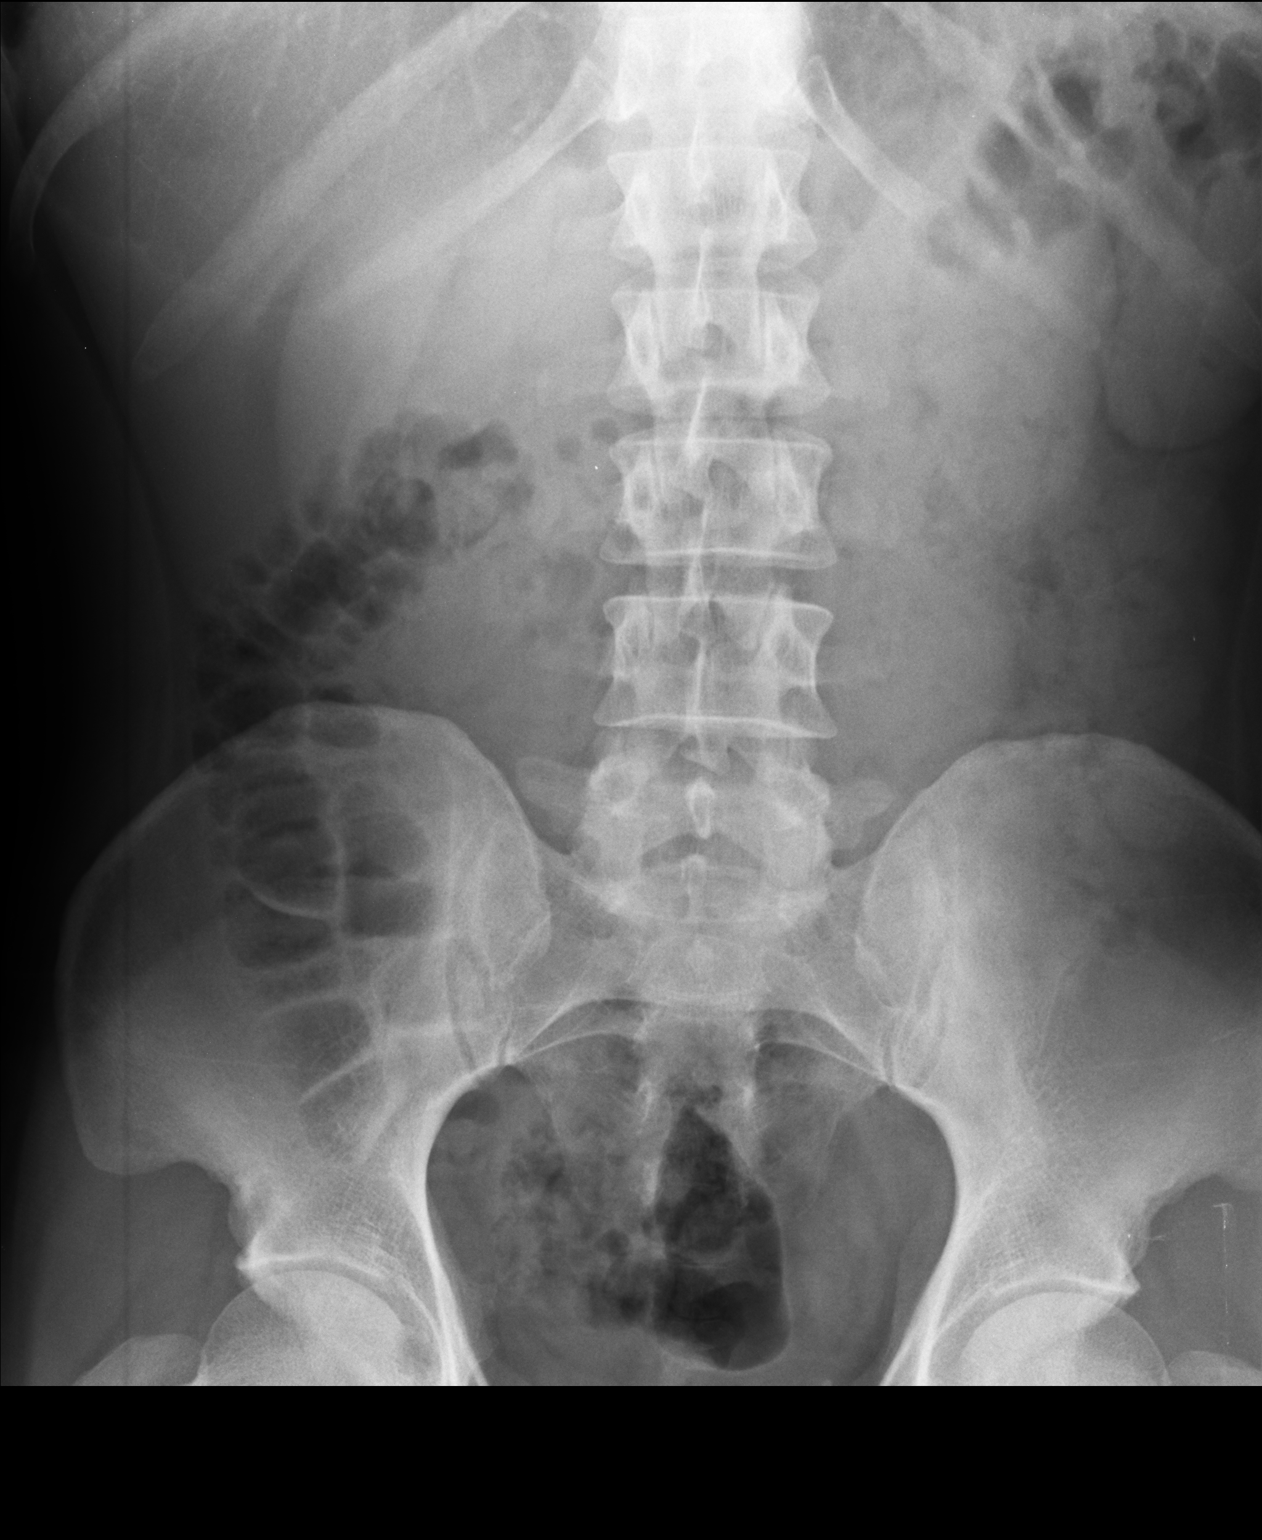

[3 of 3 positions shown; findings below may reference images not displayed]

FINDINGS: There is no evidence of dilated bowel loops or free intraperitoneal
air. No radiopaque calculi or other significant radiographic
abnormality is seen. Heart size and mediastinal contours are within
normal limits. Both lungs are clear.
IMPRESSION: Negative abdominal radiographs.  No acute cardiopulmonary disease.

## 2016-04-18 IMAGING — CR DG CHEST 1V
1 series · 1 of 1 positions shown · non-contrast
Comparison: Chest radiograph frontal performed today

CLINICAL DATA: Evaluate for pleural effusion

EXAM:
CHEST  1 VIEW

[lateral]
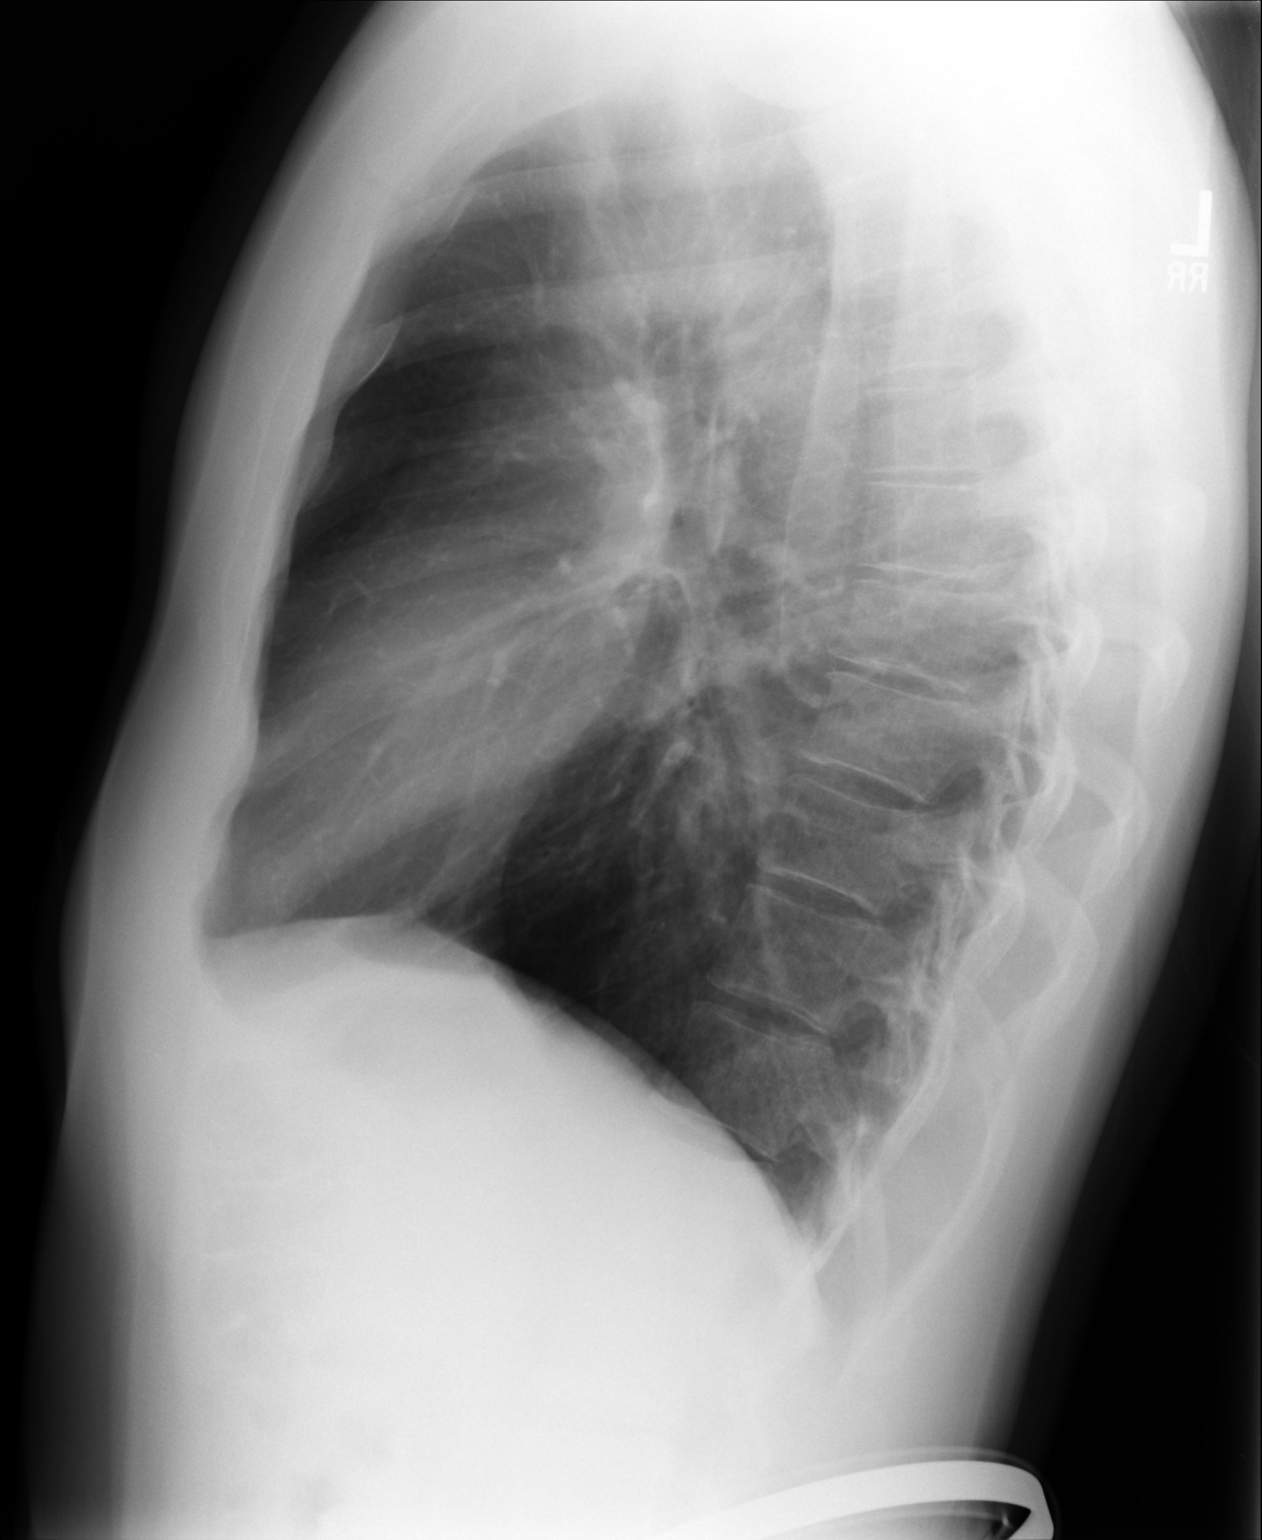

[1 of 1 positions shown; findings below may reference images not displayed]

FINDINGS: Lungs clear.  Normal heart size.  No effusion.
IMPRESSION: Negative

## 2016-04-24 IMAGING — US US ABDOMEN LIMITED
1 series · 14 of 25 positions shown · non-contrast
Comparison: None.

CLINICAL DATA: Abdominal pain and postprandial fullness for 1 week

EXAM:
US ABDOMEN LIMITED - RIGHT UPPER QUADRANT

[Series 1: us abdomen limited · 0.18mm/px · 14 of 51 slices shown]
[im 1/51]
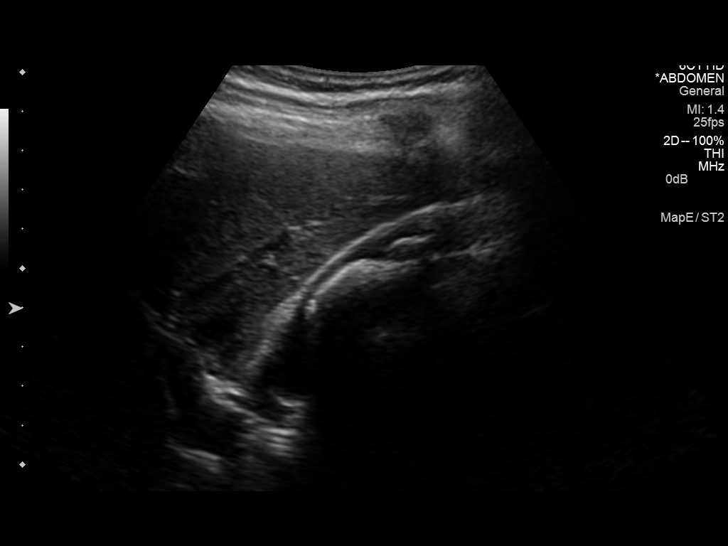
[im 5/51]
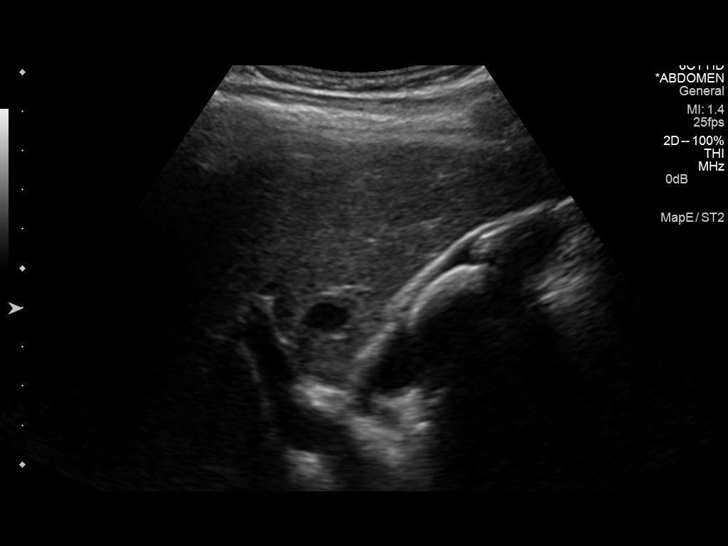
[im 9/51]
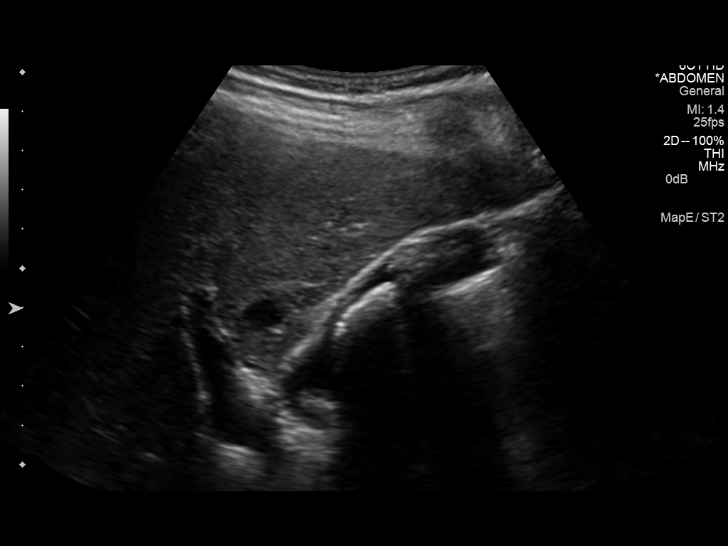
[im 13/51]
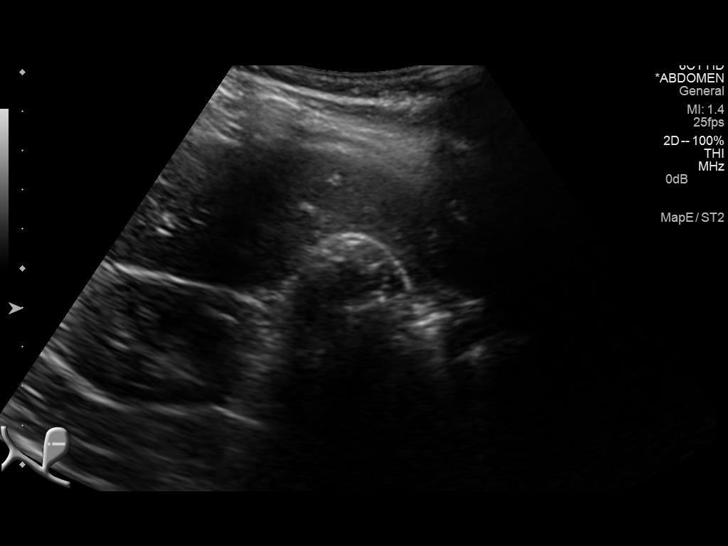
[im 17/51]
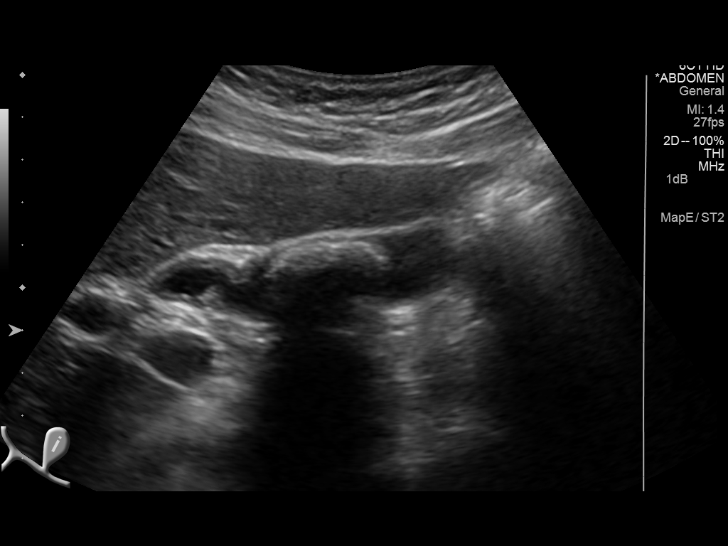
[im 19/51]
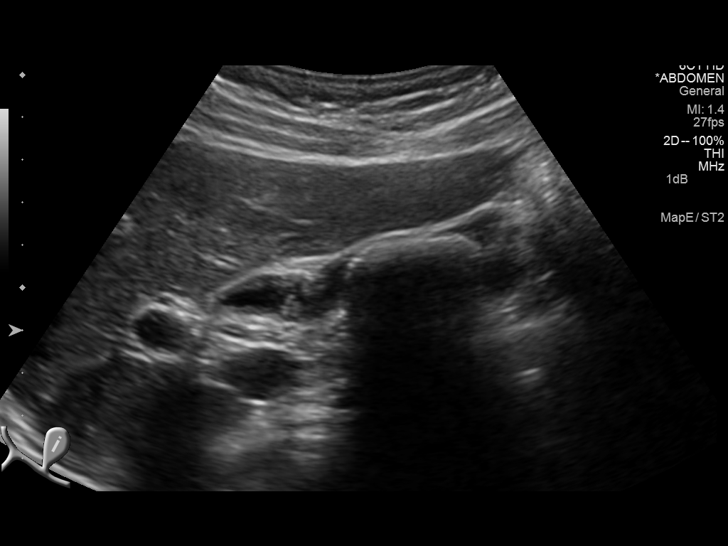
[im 23/51]
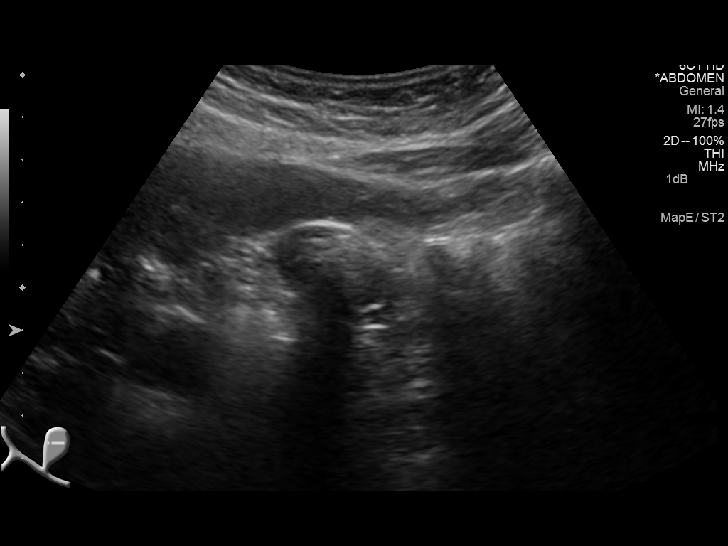
[im 28/51]
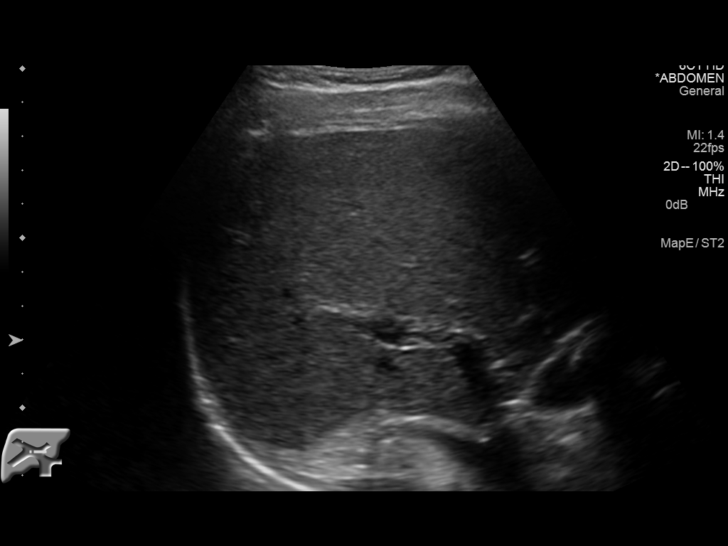
[im 32/51]
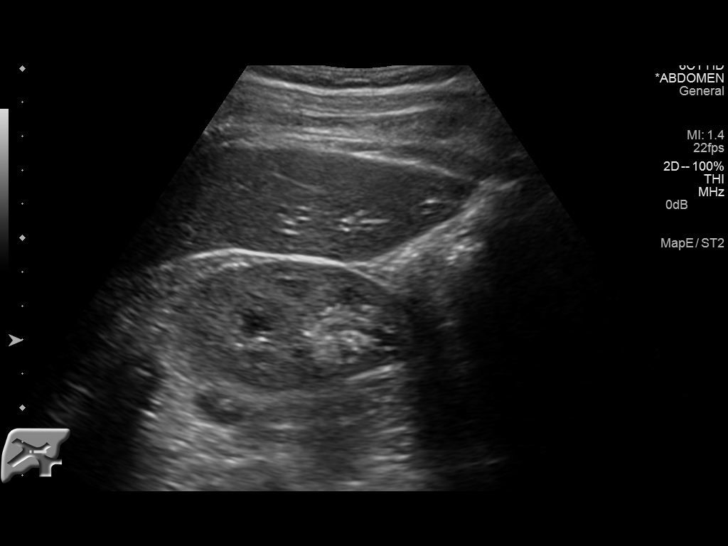
[im 34/51]
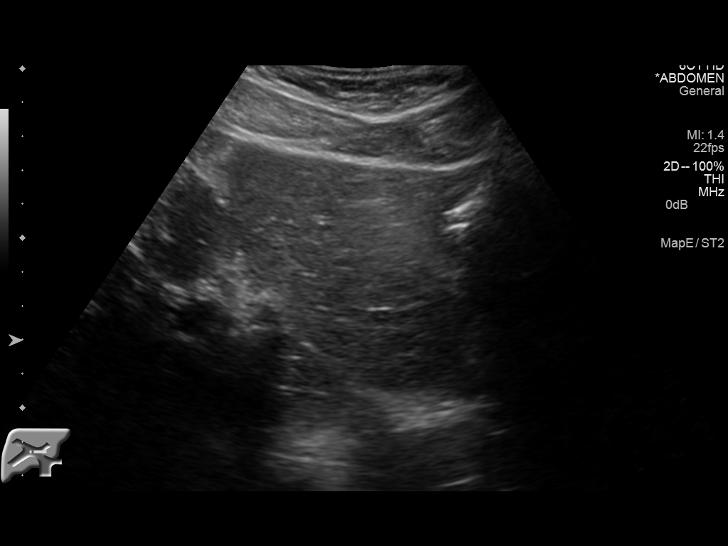
[im 38/51]
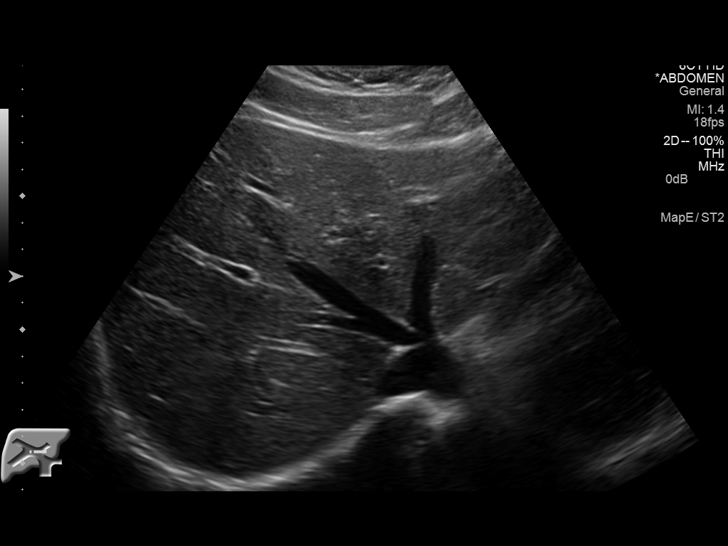
[im 42/51]
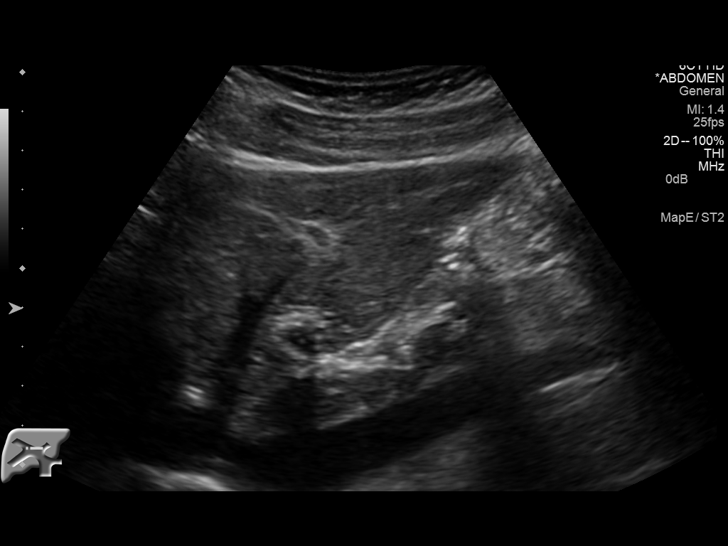
[im 46/51]
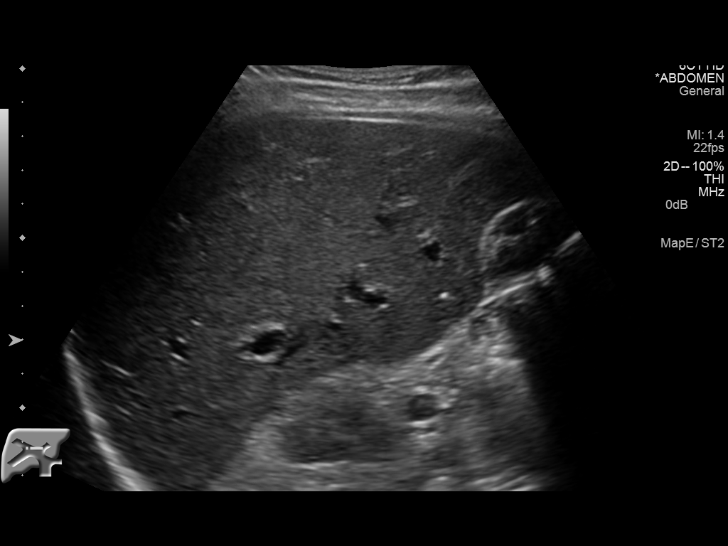
[im 51/51]
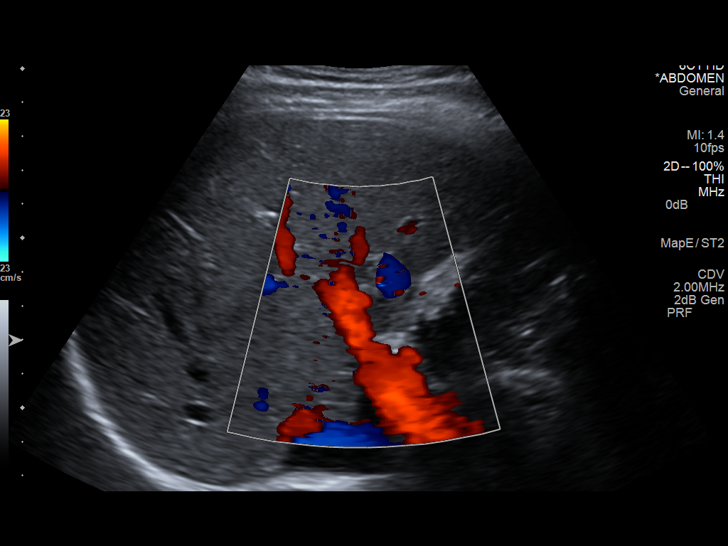

[14 of 25 positions shown; findings below may reference images not displayed]

FINDINGS: Gallbladder:

Within the gallbladder, there are echogenic foci which move and
shadow consistent with gallstones. The largest gallstone measures
2.7 cm in length. There is no gallbladder wall thickening or
pericholecystic fluid. No sonographic Murphy sign noted.

Common bile duct:

Diameter: 2 mm. There is no intrahepatic or extrahepatic biliary
duct dilatation.

Liver:

No focal lesion identified. Within normal limits in parenchymal
echogenicity.
IMPRESSION: Cholelithiasis.  Study otherwise unremarkable.

## 2016-09-23 ENCOUNTER — Encounter: Payer: 59 | Admitting: Family

## 2016-09-24 ENCOUNTER — Ambulatory Visit (INDEPENDENT_AMBULATORY_CARE_PROVIDER_SITE_OTHER): Payer: 59 | Admitting: Family

## 2016-09-24 ENCOUNTER — Encounter: Payer: Self-pay | Admitting: Family

## 2016-09-24 DIAGNOSIS — Z1152 Encounter for screening for COVID-19: Secondary | ICD-10-CM | POA: Insufficient documentation

## 2016-09-24 DIAGNOSIS — Z0000 Encounter for general adult medical examination without abnormal findings: Secondary | ICD-10-CM | POA: Diagnosis not present

## 2016-09-24 DIAGNOSIS — Z131 Encounter for screening for diabetes mellitus: Secondary | ICD-10-CM | POA: Insufficient documentation

## 2016-09-24 NOTE — Assessment & Plan Note (Signed)
1) Anticipatory Guidance: Discussed importance of wearing a seatbelt while driving and not texting while driving; changing batteries in smoke detector at least once annually; wearing suntan lotion when outside; eating a balanced and moderate diet; getting physical activity at least 30 minutes per day.  2) Immunizations / Screenings / Labs:  Declines tetanus. All other immunizations are up-to-date per recommendations. All screenings are up-to-date per recommendations. Obtain CBC, CMET, and lipid profile.    Overall well exam with minimal risk factors for cardiovascular and chronic disease in the future. He is of good weight and exercises regularly. Consumes minimal amounts of caffeine and is eating a balanced, varied, and moderate nutritional intake. Continue healthy lifestyle behaviors and choices. Follow-up prevention exam in 1 year. Follow-up office visit pending blood work if necessary.

## 2016-09-24 NOTE — Patient Instructions (Signed)
Thank you for choosing [facility].  SUMMARY AND INSTRUCTIONS:  Labs:  Please stop by the lab on the lower level of the building for your blood work. Your results will be released to MyChart (or called to you) after review, usually within 72 hours after test completion. If any changes need to be made, you will be notified at that same time.  1.) The lab is open from 7:30am to 5:30 pm Monday-Friday 2.) No appointment is necessary 3.) Fasting (if needed) is 6-8 hours after food and drink; black coffee and water are okay   Follow up:  If your symptoms worsen or fail to improve, please contact our office for further instruction, or in case of emergency go directly to the emergency room at the closest medical facility.     Health Maintenance, Male A healthy lifestyle and preventive care is important for your health and wellness. Ask your health care provider about what schedule of regular examinations is right for you. What should I know about weight and diet? Eat a Healthy Diet  Eat plenty of vegetables, fruits, whole grains, low-fat dairy products, and lean protein.  Do not eat a lot of foods high in solid fats, added sugars, or salt.  Maintain a Healthy Weight Regular exercise can help you achieve or maintain a healthy weight. You should:  Do at least 150 minutes of exercise each week. The exercise should increase your heart rate and make you sweat (moderate-intensity exercise).  Do strength-training exercises at least twice a week.  Watch Your Levels of Cholesterol and Blood Lipids  Have your blood tested for lipids and cholesterol every 5 years starting at 35 years of age. If you are at high risk for heart disease, you should start having your blood tested when you are 35 years old. You may need to have your cholesterol levels checked more often if: ? Your lipid or cholesterol levels are high. ? You are older than 35 years of age. ? You are at high risk for heart  disease.  What should I know about cancer screening? Many types of cancers can be detected early and may often be prevented. Lung Cancer  You should be screened every year for lung cancer if: ? You are a current smoker who has smoked for at least 30 years. ? You are a former smoker who has quit within the past 15 years.  Talk to your health care provider about your screening options, when you should start screening, and how often you should be screened.  Colorectal Cancer  Routine colorectal cancer screening usually begins at 35 years of age and should be repeated every 5-10 years until you are 35 years old. You may need to be screened more often if early forms of precancerous polyps or small growths are found. Your health care provider may recommend screening at an earlier age if you have risk factors for colon cancer.  Your health care provider may recommend using home test kits to check for hidden blood in the stool.  A small camera at the end of a tube can be used to examine your colon (sigmoidoscopy or colonoscopy). This checks for the earliest forms of colorectal cancer.  Prostate and Testicular Cancer  Depending on your age and overall health, your health care provider may do certain tests to screen for prostate and testicular cancer.  Talk to your health care provider about any symptoms or concerns you have about testicular or prostate cancer.  Skin Cancer  Check   your skin from head to toe regularly.  Tell your health care provider about any new moles or changes in moles, especially if: ? There is a change in a mole's size, shape, or color. ? You have a mole that is larger than a pencil eraser.  Always use sunscreen. Apply sunscreen liberally and repeat throughout the day.  Protect yourself by wearing long sleeves, pants, a wide-brimmed hat, and sunglasses when outside.  What should I know about heart disease, diabetes, and high blood pressure?  If you are 18-39 years  of age, have your blood pressure checked every 3-5 years. If you are 40 years of age or older, have your blood pressure checked every year. You should have your blood pressure measured twice-once when you are at a hospital or clinic, and once when you are not at a hospital or clinic. Record the average of the two measurements. To check your blood pressure when you are not at a hospital or clinic, you can use: ? An automated blood pressure machine at a pharmacy. ? A home blood pressure monitor.  Talk to your health care provider about your target blood pressure.  If you are between 45-79 years old, ask your health care provider if you should take aspirin to prevent heart disease.  Have regular diabetes screenings by checking your fasting blood sugar level. ? If you are at a normal weight and have a low risk for diabetes, have this test once every three years after the age of 45. ? If you are overweight and have a high risk for diabetes, consider being tested at a younger age or more often.  A one-time screening for abdominal aortic aneurysm (AAA) by ultrasound is recommended for men aged 65-75 years who are current or former smokers. What should I know about preventing infection? Hepatitis B If you have a higher risk for hepatitis B, you should be screened for this virus. Talk with your health care provider to find out if you are at risk for hepatitis B infection. Hepatitis C Blood testing is recommended for:  Everyone born from 1945 through 1965.  Anyone with known risk factors for hepatitis C.  Sexually Transmitted Diseases (STDs)  You should be screened each year for STDs including gonorrhea and chlamydia if: ? You are sexually active and are younger than 35 years of age. ? You are older than 35 years of age and your health care provider tells you that you are at risk for this type of infection. ? Your sexual activity has changed since you were last screened and you are at an increased  risk for chlamydia or gonorrhea. Ask your health care provider if you are at risk.  Talk with your health care provider about whether you are at high risk of being infected with HIV. Your health care provider may recommend a prescription medicine to help prevent HIV infection.  What else can I do?  Schedule regular health, dental, and eye exams.  Stay current with your vaccines (immunizations).  Do not use any tobacco products, such as cigarettes, chewing tobacco, and e-cigarettes. If you need help quitting, ask your health care provider.  Limit alcohol intake to no more than 2 drinks per day. One drink equals 12 ounces of beer, 5 ounces of wine, or 1 ounces of hard liquor.  Do not use street drugs.  Do not share needles.  Ask your health care provider for help if you need support or information about quitting drugs.  Tell your   health care provider if you often feel depressed.  Tell your health care provider if you have ever been abused or do not feel safe at home. This information is not intended to replace advice given to you by your health care provider. Make sure you discuss any questions you have with your health care provider. Document Released: 09/13/2007 Document Revised: 11/14/2015 Document Reviewed: 12/19/2014 Elsevier Interactive Patient Education  2018 [employer] Inc[employer]  

## 2016-09-24 NOTE — Progress Notes (Signed)
Subjective:    Patient ID: Billy Phillips, male    DOB: 03/08/1981, 36 y.o.   MRN: [id number]  Chief Complaint  Patient presents with  . CPE    not fasting     HPI:  Billy DickerChristopher Phillips is a 36 y.o. male who presents today for an annual wellness visit.   1) Health Maintenance -   Diet - Averages about 5 small meals per day consisting of a regular diet; Caffeine intake is 1-2 cups per day on average  Exercise - 4x per week consisting of resistance training    2) Preventative Exams / Immunizations:  Dental -- Up to date  Vision -- Up to date   Health Maintenance  Topic Date Due  . HIV Screening  04/04/1996  . TETANUS/TDAP  08/31/2017 (Originally 04/04/2000)  . INFLUENZA VACCINE  10/29/2016    Immunization History  Administered Date(s) Administered  . Influenza,inj,Quad PF,36+ Mos 11/27/2014     No Known Allergies   Outpatient Medications Prior to Visit  Medication Sig Dispense Refill  . BIOTIN PO Take 1 tablet by mouth daily.    . naproxen (NAPROSYN) 500 MG tablet Take 1 tablet (500 mg total) by mouth 2 (two) times daily. 30 tablet 0   No facility-administered medications prior to visit.      Past Medical History:  Diagnosis Date  . Gilbert syndrome   . Hemorrhoid   . IBS (irritable bowel syndrome)      Past Surgical History:  Procedure Laterality Date  . CHOLECYSTECTOMY N/A 08/23/2014   Procedure: LAPAROSCOPIC CHOLECYSTECTOMY WITH INTRAOPERATIVE CHOLANGIOGRAM ;  Surgeon: Claud KelpHaywood Ingram, MD;  Location: [facility];  Service: General;  Laterality: N/A;  . WISDOM TOOTH EXTRACTION       Family History  Problem Relation Age of Onset  . Hyperlipidemia Father   . Hypertension Father   . Hyperlipidemia Sister   . Colon cancer Maternal Grandmother   . Hyperlipidemia Paternal Grandmother   . Diabetes Paternal Grandfather   . Colon polyps Mother      Social History   Social History  . Marital status: Married    Spouse name: Billy DikeJennifer  . Number  of children: 2  . Years of education: 16   Occupational History  . Communications    Social History Main Topics  . Smoking status: Never Smoker  . Smokeless tobacco: Never Used  . Alcohol use 0.0 oz/week     Comment: rare  . Drug use: No  . Sexual activity: Yes    Partners: Female   Other Topics Concern  . Not on file   Social History Narrative   Fun: Workout, hang out with the children   Denies religious beliefs effecting health care.       Review of Systems  Constitutional: Denies fever, chills, fatigue, or significant weight gain/loss. HENT: Head: Denies headache or neck pain Ears: Denies changes in hearing, ringing in ears, earache, drainage Nose: Denies discharge, stuffiness, itching, nosebleed, sinus pain Throat: Denies sore throat, hoarseness, dry mouth, sores, thrush Eyes: Denies loss/changes in vision, pain, redness, blurry/double vision, flashing lights Cardiovascular: Denies chest pain/discomfort, tightness, palpitations, shortness of breath with activity, difficulty lying down, swelling, sudden awakening with shortness of breath Respiratory: Denies shortness of breath, cough, sputum production, wheezing Gastrointestinal: Denies dysphasia, heartburn, change in appetite, nausea, change in bowel habits, rectal bleeding, constipation, diarrhea, yellow skin or eyes Genitourinary: Denies frequency, urgency, burning/pain, blood in urine, incontinence, change in urinary strength. Musculoskeletal: Denies muscle/joint pain, stiffness, back pain,  redness or swelling of joints, trauma Skin: Denies rashes, lumps, itching, dryness, color changes, or hair/nail changes Neurological: Denies dizziness, fainting, seizures, weakness, numbness, tingling, tremor Psychiatric - Denies nervousness, stress, depression or memory loss Endocrine: Denies heat or cold intolerance, sweating, frequent urination, excessive thirst, changes in appetite Hematologic: Denies ease of bruising or  bleeding     Objective:    BP 134/72 (BP Location: Left Arm, Patient Position: Sitting, Cuff Size: Large)   Pulse 75   Resp 16   Ht 5\' 10"  (1.778 m)   Wt 185 lb 6.4 oz (84.1 kg)   SpO2 98%   BMI 26.60 kg/m  Nursing note and vital signs reviewed.  Physical Exam  Constitutional: He is oriented to person, place, and time. He appears well-developed and well-nourished. No distress.  HENT:  Head: Normocephalic.  Right Ear: Hearing, tympanic membrane, external ear and ear canal normal.  Left Ear: Hearing, tympanic membrane, external ear and ear canal normal.  Nose: Nose normal.  Mouth/Throat: Uvula is midline, oropharynx is clear and moist and mucous membranes are normal.  Eyes: Conjunctivae and EOM are normal. Pupils are equal, round, and reactive to light.  Neck: Neck supple. No JVD present. No tracheal deviation present. No thyromegaly present.  Cardiovascular: Normal rate, regular rhythm, normal heart sounds and intact distal pulses.   Pulmonary/Chest: Effort normal and breath sounds normal.  Abdominal: Soft. Bowel sounds are normal. He exhibits no distension and no mass. There is no tenderness. There is no rebound and no guarding.  Musculoskeletal: Normal range of motion. He exhibits no edema or tenderness.  Lymphadenopathy:    He has no cervical adenopathy.  Neurological: He is alert and oriented to person, place, and time. He has normal reflexes. No cranial nerve deficit. He exhibits normal muscle tone. Coordination normal.  Skin: Skin is warm and dry.  Psychiatric: He has a normal mood and affect. His behavior is normal. Judgment and thought content normal.       Assessment & Plan:   Problem List Items Addressed This Visit      Other   Routine adult health maintenance    1) Anticipatory Guidance: Discussed importance of wearing a seatbelt while driving and not texting while driving; changing batteries in smoke detector at least once annually; wearing suntan lotion when  outside; eating a balanced and moderate diet; getting physical activity at least 30 minutes per day.  2) Immunizations / Screenings / Labs:  Declines tetanus. All other immunizations are up-to-date per recommendations. All screenings are up-to-date per recommendations. Obtain CBC, CMET, and lipid profile.    Overall well exam with minimal risk factors for cardiovascular and chronic disease in the future. He is of good weight and exercises regularly. Consumes minimal amounts of caffeine and is eating a balanced, varied, and moderate nutritional intake. Continue healthy lifestyle behaviors and choices. Follow-up prevention exam in 1 year. Follow-up office visit pending blood work if necessary.        Relevant Orders   CBC   Comprehensive metabolic panel   Lipid panel      I have discontinued Mr. Bonneau BIOTIN PO and naproxen.   Follow-up: Return if symptoms worsen or fail to improve.   Jeanine Luz, FNP

## 2016-09-25 ENCOUNTER — Other Ambulatory Visit (INDEPENDENT_AMBULATORY_CARE_PROVIDER_SITE_OTHER): Payer: 59

## 2016-09-25 DIAGNOSIS — Z0000 Encounter for general adult medical examination without abnormal findings: Secondary | ICD-10-CM | POA: Diagnosis not present

## 2016-09-25 LAB — COMPREHENSIVE METABOLIC PANEL
ALBUMIN: 4.8 g/dL (ref 3.5–5.2)
ALK PHOS: 61 U/L (ref 39–117)
ALT: 23 U/L (ref 0–53)
AST: 17 U/L (ref 0–37)
BILIRUBIN TOTAL: 3.1 mg/dL — AB (ref 0.2–1.2)
BUN: 15 mg/dL (ref 6–23)
CALCIUM: 9.9 mg/dL (ref 8.4–10.5)
CO2: 28 mEq/L (ref 19–32)
Chloride: 103 mEq/L (ref 96–112)
Creatinine, Ser: 1.21 mg/dL (ref 0.40–1.50)
GFR: 72.33 mL/min (ref >60.00)
Glucose, Bld: 94 mg/dL (ref 70–99)
POTASSIUM: 4.6 meq/L (ref 3.5–5.1)
Sodium: 138 mEq/L (ref 135–145)
TOTAL PROTEIN: 6.6 g/dL (ref 6.0–8.3)

## 2016-09-25 LAB — CBC
HEMATOCRIT: 46.7 % (ref 39.0–52.0)
HEMOGLOBIN: 16 g/dL (ref 13.0–17.0)
MCHC: 34.3 g/dL (ref 30.0–36.0)
MCV: 86.7 fl (ref 78.0–100.0)
PLATELETS: 201 10*3/uL (ref 150.0–400.0)
RBC: 5.39 Mil/uL (ref 4.22–5.81)
RDW: 12.9 % (ref 11.5–15.5)
WBC: 7.2 10*3/uL (ref 4.0–10.5)

## 2016-09-25 LAB — LIPID PANEL
CHOLESTEROL: 119 mg/dL (ref 0–200)
HDL: 36.9 mg/dL — AB (ref >39.00)
LDL Cholesterol: 72 mg/dL (ref 0–99)
NonHDL: 81.73
Total CHOL/HDL Ratio: 3
Triglycerides: 48 mg/dL (ref 0.0–149.0)
VLDL: 9.6 mg/dL (ref 0.0–40.0)

## 2016-09-28 ENCOUNTER — Encounter: Payer: Self-pay | Admitting: Family

## 2017-12-23 ENCOUNTER — Encounter: Payer: Self-pay | Admitting: Family Medicine

## 2017-12-23 ENCOUNTER — Ambulatory Visit (INDEPENDENT_AMBULATORY_CARE_PROVIDER_SITE_OTHER): Payer: 59 | Admitting: Family Medicine

## 2017-12-23 VITALS — BP 126/80 | HR 70 | Ht 70.0 in | Wt 193.4 lb

## 2017-12-23 DIAGNOSIS — Z0000 Encounter for general adult medical examination without abnormal findings: Secondary | ICD-10-CM | POA: Diagnosis not present

## 2017-12-23 LAB — URINALYSIS, ROUTINE W REFLEX MICROSCOPIC
BILIRUBIN URINE: NEGATIVE
Hgb urine dipstick: NEGATIVE
Ketones, ur: NEGATIVE
Leukocytes, UA: NEGATIVE
Nitrite: NEGATIVE
PH: 6.5 (ref 5.0–8.0)
RBC / HPF: NONE SEEN (ref 0–?)
SPECIFIC GRAVITY, URINE: 1.01 (ref 1.000–1.030)
TOTAL PROTEIN, URINE-UPE24: NEGATIVE
URINE GLUCOSE: NEGATIVE
Urobilinogen, UA: 0.2 (ref 0.0–1.0)

## 2017-12-23 LAB — BASIC METABOLIC PANEL
BUN: 14 mg/dL (ref 6–23)
CALCIUM: 9.3 mg/dL (ref 8.4–10.5)
CO2: 30 meq/L (ref 19–32)
CREATININE: 1.08 mg/dL (ref 0.40–1.50)
Chloride: 105 mEq/L (ref 96–112)
GFR: 81.9 mL/min (ref >60.00)
Glucose, Bld: 94 mg/dL (ref 70–99)
Potassium: 4 mEq/L (ref 3.5–5.1)
Sodium: 141 mEq/L (ref 135–145)

## 2017-12-23 LAB — TSH: TSH: 1.58 u[IU]/mL (ref 0.35–4.50)

## 2017-12-23 LAB — LIPID PANEL
Cholesterol: 137 mg/dL (ref 0–200)
HDL: 38.2 mg/dL — ABNORMAL LOW (ref >39.00)
LDL CALC: 89 mg/dL (ref 0–99)
NONHDL: 98.66
TRIGLYCERIDES: 47 mg/dL (ref 0.0–149.0)
Total CHOL/HDL Ratio: 4
VLDL: 9.4 mg/dL (ref 0.0–40.0)

## 2017-12-23 LAB — HEPATIC FUNCTION PANEL
ALBUMIN: 4.6 g/dL (ref 3.5–5.2)
ALT: 16 U/L (ref 0–53)
AST: 13 U/L (ref 0–37)
Alkaline Phosphatase: 51 U/L (ref 39–117)
Bilirubin, Direct: 0.3 mg/dL (ref 0.0–0.3)
TOTAL PROTEIN: 6.5 g/dL (ref 6.0–8.3)
Total Bilirubin: 2 mg/dL — ABNORMAL HIGH (ref 0.2–1.2)

## 2017-12-23 NOTE — Patient Instructions (Signed)

## 2017-12-23 NOTE — Progress Notes (Signed)
Subjective:  Patient ID: Billy Phillips, male    DOB: 1980-08-08  Age: 37 y.o. MRN: [id number]  CC: Establish Care   HPI Billy Phillips presents for a physical exam.  He is fasting.  He lives with his wife and his 21 and 62-year-old children.  They are doing well.  Older Billy is in first grade and 64-year-old is in pre-k.  He works in [employer].  He is active in the gym 3 days a week.  He does not smoke or use illicit drugs.  He rarely drinks alcohol because it can lead to some nausea and malaise.  He has never had issues with alcohol he just does not seem to agree with him.  He is status post cholecystectomy.  His sister had gallstones as well.  He has a history of Gilbert's disease.  Briefly discussed that this has no bearing on his long-term health.  Status post recent eye check.  He is up-to-date on his dental checks.  Mom has been diagnosed with colon polyps and patient has had a colonoscopy that was negative.  His father has elevated cholesterol and high blood pressure.  No outpatient medications prior to visit.   No facility-administered medications prior to visit.     ROS Review of Systems  Constitutional: Negative.   HENT: Negative.   Respiratory: Negative.   Cardiovascular: Negative.   Gastrointestinal: Negative.   Endocrine: Negative for polyphagia and polyuria.  Genitourinary: Negative.   Musculoskeletal: Negative.   Skin: Negative for pallor and rash.  Allergic/Immunologic: Negative for immunocompromised state.  Neurological: Negative.   Hematological: Negative.   Psychiatric/Behavioral: Negative.     Objective:  BP 126/80   Pulse 70   Ht 5\' 10"  (1.778 m)   Wt 193 lb 6 oz (87.7 kg)   SpO2 99%   BMI 27.75 kg/m   BP Readings from Last 3 Encounters:  12/23/17 126/80  09/24/16 134/72  11/28/15 120/72    Wt Readings from Last 3 Encounters:  12/23/17 193 lb 6 oz (87.7 kg)  09/24/16 185 lb 6.4 oz (84.1 kg)  11/28/15 197 lb 12.8 oz (89.7 kg)     Physical Exam  Constitutional: He is oriented to person, place, and time. He appears well-developed and well-nourished. No distress.  HENT:  Head: Normocephalic and atraumatic.  Right Ear: External ear normal.  Left Ear: External ear normal.  Mouth/Throat: Oropharynx is clear and moist. No oropharyngeal exudate.  Eyes: Pupils are equal, round, and reactive to light. Conjunctivae and EOM are normal. Right eye exhibits no discharge. Left eye exhibits no discharge. No scleral icterus.  Neck: Neck supple. No JVD present. No tracheal deviation present. No thyromegaly present.  Cardiovascular: Normal rate, regular rhythm and normal heart sounds.  Pulmonary/Chest: Effort normal and breath sounds normal.  Abdominal: Soft. Bowel sounds are normal. He exhibits no distension and no mass. There is no tenderness. There is no rebound and no guarding. No hernia. Hernia confirmed negative in the right inguinal area and confirmed negative in the left inguinal area.  Genitourinary: Penis normal. Right testis shows no mass, no swelling and no tenderness. Right testis is descended. Left testis shows no mass, no swelling and no tenderness. Left testis is descended. Circumcised. No hypospadias or penile tenderness. No discharge found.  Musculoskeletal: Normal range of motion.  Lymphadenopathy:    He has no cervical adenopathy. No inguinal adenopathy noted on the right or left side.  Neurological: He is alert and oriented to person, place, and time.  Skin: Skin is warm and dry. Capillary refill takes less than 2 seconds. He is not diaphoretic.  Psychiatric: He has a normal mood and affect. His behavior is normal.    Lab Results  Component Value Date   WBC 7.2 09/25/2016   HGB 16.0 09/25/2016   HCT 46.7 09/25/2016   PLT 201.0 09/25/2016   GLUCOSE 94 09/25/2016   CHOL 119 09/25/2016   TRIG 48.0 09/25/2016   HDL 36.90 (L) 09/25/2016   LDLCALC 72 09/25/2016   ALT 23 09/25/2016   AST 17 09/25/2016   NA  138 09/25/2016   K 4.6 09/25/2016   CL 103 09/25/2016   CREATININE 1.21 09/25/2016   BUN 15 09/25/2016   CO2 28 09/25/2016   TSH 1.41 12/21/2014    No results found.  Assessment & Plan:   Billy Phillips was seen today for establish care.  Diagnoses and all orders for this visit:  Healthcare maintenance -     Basic metabolic panel -     Lipid panel -     TSH -     Urinalysis, Routine w reflex microscopic -     Hepatic function panel   Billy Phillips "[name]" does not currently have medications on file.  No orders of the defined types were placed in this encounter.  Patient is a healthy normal white male.  Anticipate normal blood work.  Encouraged to exercise for 30 minutes at least 5 days a week.  Patient given anticipatory guidance for health maintenance and disease prevention.  Follow-up in 1 year or sooner as needed.  Follow-up: Return in about 1 year (around 12/24/2018), or if symptoms worsen or fail to improve.  Mliss Sax, MD

## 2020-03-15 ENCOUNTER — Telehealth: Payer: Self-pay | Admitting: Family Medicine

## 2020-03-15 NOTE — Telephone Encounter (Signed)
error 

## 2020-03-19 ENCOUNTER — Ambulatory Visit (INDEPENDENT_AMBULATORY_CARE_PROVIDER_SITE_OTHER): Payer: 59 | Admitting: Family

## 2020-03-19 ENCOUNTER — Encounter: Payer: Self-pay | Admitting: Family

## 2020-03-19 ENCOUNTER — Other Ambulatory Visit: Payer: Self-pay

## 2020-03-19 VITALS — BP 136/78 | HR 72 | Temp 97.3°F | Ht 70.0 in | Wt 198.3 lb

## 2020-03-19 DIAGNOSIS — J069 Acute upper respiratory infection, unspecified: Secondary | ICD-10-CM

## 2020-03-19 DIAGNOSIS — R042 Hemoptysis: Secondary | ICD-10-CM | POA: Diagnosis not present

## 2020-03-19 NOTE — Patient Instructions (Signed)
Upper Respiratory Infection, Adult An upper respiratory infection (URI) is a common viral infection of the nose, throat, and upper air passages that lead to the lungs. The most common type of URI is the common cold. URIs usually get better on their own, without medical treatment. What are the causes? A URI is caused by a virus. You may catch a virus by:  Breathing in droplets from an infected person's cough or sneeze.  Touching something that has been exposed to the virus (contaminated) and then touching your mouth, nose, or eyes. What increases the risk? You are more likely to get a URI if:  You are very young or very old.  It is autumn or winter.  You have close contact with others, such as at a daycare, school, or health care facility.  You smoke.  You have long-term (chronic) heart or lung disease.  You have a weakened disease-fighting (immune) system.  You have nasal allergies or asthma.  You are experiencing a lot of stress.  You work in an area that has poor air circulation.  You have poor nutrition. What are the signs or symptoms? A URI usually involves some of the following symptoms:  Runny or stuffy (congested) nose.  Sneezing.  Cough.  Sore throat.  Headache.  Fatigue.  Fever.  Loss of appetite.  Pain in your forehead, behind your eyes, and over your cheekbones (sinus pain).  Muscle aches.  Redness or irritation of the eyes.  Pressure in the ears or face. How is this diagnosed? This condition may be diagnosed based on your medical history and symptoms, and a physical exam. Your health care provider may use a cotton swab to take a mucus sample from your nose (nasal swab). This sample can be tested to determine what virus is causing the illness. How is this treated? URIs usually get better on their own within 7-10 days. You can take steps at home to relieve your symptoms. Medicines cannot cure URIs, but your health care provider may recommend  certain medicines to help relieve symptoms, such as:  Over-the-counter cold medicines.  Cough suppressants. Coughing is a type of defense against infection that helps to clear the respiratory system, so take these medicines only as recommended by your health care provider.  Fever-reducing medicines. Follow these instructions at home: Activity  Rest as needed.  If you have a fever, stay home from work or school until your fever is gone or until your health care provider says you are no longer contagious. Your health care provider may have you wear a face mask to prevent your infection from spreading. Relieving symptoms  Gargle with a salt-water mixture 3-4 times a day or as needed. To make a salt-water mixture, completely dissolve -1 tsp of salt in 1 cup of warm water.  Use a cool-mist humidifier to add moisture to the air. This can help you breathe more easily. Eating and drinking   Drink enough fluid to keep your urine pale yellow.  Eat soups and other clear broths. General instructions   Take over-the-counter and prescription medicines only as told by your health care provider. These include cold medicines, fever reducers, and cough suppressants.  Do not use any products that contain nicotine or tobacco, such as cigarettes and e-cigarettes. If you need help quitting, ask your health care provider.  Stay away from secondhand smoke.  Stay up to date on all immunizations, including the yearly (annual) flu vaccine.  Keep all follow-up visits as told by your health   care provider. This is important. How to prevent the spread of infection to others   URIs can be passed from person to person (are contagious). To prevent the infection from spreading: ? Wash your hands often with soap and water. If soap and water are not available, use hand sanitizer. ? Avoid touching your mouth, face, eyes, or nose. ? Cough or sneeze into a tissue or your sleeve or elbow instead of into your hand  or into the air. Contact a health care provider if:  You are getting worse instead of better.  You have a fever or chills.  Your mucus is brown or red.  You have yellow or brown discharge coming from your nose.  You have pain in your face, especially when you bend forward.  You have swollen neck glands.  You have pain while swallowing.  You have white areas in the back of your throat. Get help right away if:  You have shortness of breath that gets worse.  You have severe or persistent: ? Headache. ? Ear pain. ? Sinus pain. ? Chest pain.  You have chronic lung disease along with any of the following: ? Wheezing. ? Prolonged cough. ? Coughing up blood. ? A change in your usual mucus.  You have a stiff neck.  You have changes in your: ? Vision. ? Hearing. ? Thinking. ? Mood. Summary  An upper respiratory infection (URI) is a common infection of the nose, throat, and upper air passages that lead to the lungs.  A URI is caused by a virus.  URIs usually get better on their own within 7-10 days.  Medicines cannot cure URIs, but your health care provider may recommend certain medicines to help relieve symptoms. This information is not intended to replace advice given to you by your health care provider. Make sure you discuss any questions you have with your health care provider. Document Revised: 03/25/2018 Document Reviewed: 10/31/2016 Elsevier Patient Education  2020 [employer]    

## 2020-03-19 NOTE — Progress Notes (Signed)
Acute Office Visit  Subjective:    Patient ID: Billy Phillips, male    DOB: 02-27-1981, 39 y.o.   MRN: [id number]  Chief Complaint  Patient presents with  . URI    Cough achy ears little clogged up. Patient notice blood in mucus all symptoms last week.     HPI Patient is in today with concerns of blood-tinged sputum Thursday that has since cleared. He has been taking Mucinex for cough and congestion x about 1 week. He feels much better. Still has a slightly achy ear. He does not smoke. No recent travel.  Past Medical History:  Diagnosis Date  . Gilbert syndrome   . Hemorrhoid   . IBS (irritable bowel syndrome)     Past Surgical History:  Procedure Laterality Date  . CHOLECYSTECTOMY N/A 08/23/2014   Procedure: LAPAROSCOPIC CHOLECYSTECTOMY WITH INTRAOPERATIVE CHOLANGIOGRAM ;  Surgeon: Claud Kelp, MD;  Location: [facility];  Service: General;  Laterality: N/A;  . WISDOM TOOTH EXTRACTION      Family History  Problem Relation Age of Onset  . Hyperlipidemia Father   . Hypertension Father   . Hyperlipidemia Sister   . Colon cancer Maternal Grandmother   . Hyperlipidemia Paternal Grandmother   . Diabetes Paternal Grandfather   . Colon polyps Mother     Social History   Socioeconomic History  . Marital status: Married    Spouse name: [name]  . Number of children: 2  . Years of education: 78  . Highest education level: Not on file  Occupational History  . Occupation: Communications  Tobacco Use  . Smoking status: Never Smoker  . Smokeless tobacco: Never Used  Vaping Use  . Vaping Use: Never used  Substance and Sexual Activity  . Alcohol use: Yes    Alcohol/week: 0.0 standard drinks    Comment: rare  . Drug use: No  . Sexual activity: Yes    Partners: Female  Other Topics Concern  . Not on file  Social History Narrative   Fun: Workout, hang out with the children   Denies religious beliefs effecting health care.    Social Determinants of Health    Financial Resource Strain: Not on file  Food Insecurity: Not on file  Transportation Needs: Not on file  Physical Activity: Not on file  Stress: Not on file  Social Connections: Not on file  Intimate Partner Violence: Not on file    No outpatient medications prior to visit.   No facility-administered medications prior to visit.    No Known Allergies  Review of Systems  Constitutional: Negative for fatigue and fever.  HENT: Positive for congestion, postnasal drip and rhinorrhea. Negative for sinus pressure and sinus pain.   Respiratory: Negative.   Cardiovascular: Negative.   Gastrointestinal: Negative.   Endocrine: Negative.   Musculoskeletal: Negative.   Skin: Negative.   Allergic/Immunologic: Negative.   Neurological: Negative.   Hematological: Negative.   Psychiatric/Behavioral: Negative.   All other systems reviewed and are negative.      Objective:    Physical Exam Constitutional:      Appearance: Normal appearance.  HENT:     Right Ear: Tympanic membrane, ear canal and external ear normal. There is no impacted cerumen.     Left Ear: Tympanic membrane, ear canal and external ear normal. There is no impacted cerumen.     Nose: Nose normal. No congestion or rhinorrhea.     Mouth/Throat:     Mouth: Mucous membranes are moist.  Pharynx: Oropharynx is clear. Posterior oropharyngeal erythema present.  Cardiovascular:     Rate and Rhythm: Normal rate and regular rhythm.     Pulses: Normal pulses.  Pulmonary:     Effort: Pulmonary effort is normal.     Breath sounds: Normal breath sounds.  Musculoskeletal:        General: Normal range of motion.     Cervical back: Normal range of motion.  Skin:    General: Skin is warm and dry.  Neurological:     Mental Status: He is alert.  Psychiatric:        Mood and Affect: Mood normal.     BP 136/78   Pulse 72   Temp (!) 97.3 F (36.3 C) (Temporal)   Ht 5\' 10"  (1.778 m)   Wt 198 lb 4.5 oz (89.9 kg)   SpO2  99%   BMI 28.45 kg/m  Wt Readings from Last 3 Encounters:  03/19/20 198 lb 4.5 oz (89.9 kg)  12/23/17 193 lb 6 oz (87.7 kg)  09/24/16 185 lb 6.4 oz (84.1 kg)    Health Maintenance Due  Topic Date Due  . Hepatitis C Screening  Never done  . HIV Screening  Never done  . TETANUS/TDAP  Never done  . INFLUENZA VACCINE  10/30/2019    There are no preventive care reminders to display for this patient.   Lab Results  Component Value Date   TSH 1.58 12/23/2017   Lab Results  Component Value Date   WBC 7.2 09/25/2016   HGB 16.0 09/25/2016   HCT 46.7 09/25/2016   MCV 86.7 09/25/2016   PLT 201.0 09/25/2016   Lab Results  Component Value Date   NA 141 12/23/2017   K 4.0 12/23/2017   CO2 30 12/23/2017   GLUCOSE 94 12/23/2017   BUN 14 12/23/2017   CREATININE 1.08 12/23/2017   BILITOT 2.0 (H) 12/23/2017   ALKPHOS 51 12/23/2017   AST 13 12/23/2017   ALT 16 12/23/2017   PROT 6.5 12/23/2017   ALBUMIN 4.6 12/23/2017   CALCIUM 9.3 12/23/2017   ANIONGAP 10 08/03/2014   GFR 81.90 12/23/2017   Lab Results  Component Value Date   CHOL 137 12/23/2017   Lab Results  Component Value Date   HDL 38.20 (L) 12/23/2017   Lab Results  Component Value Date   LDLCALC 89 12/23/2017   Lab Results  Component Value Date   TRIG 47.0 12/23/2017   Lab Results  Component Value Date   CHOLHDL 4 12/23/2017   No results found for: HGBA1C     Assessment & Plan:   Problem List Items Addressed This Visit   None   Visit Diagnoses    Viral upper respiratory infection    -  Primary   Blood-tinged sputum           No orders of the defined types were placed in this encounter.    12/25/2017, FNP

## 2020-05-04 ENCOUNTER — Encounter: Payer: 59 | Admitting: Family Medicine

## 2020-06-15 ENCOUNTER — Encounter: Payer: 59 | Admitting: Family Medicine

## 2020-08-07 ENCOUNTER — Other Ambulatory Visit: Payer: Self-pay

## 2020-08-08 ENCOUNTER — Ambulatory Visit (INDEPENDENT_AMBULATORY_CARE_PROVIDER_SITE_OTHER): Payer: 59 | Admitting: Family Medicine

## 2020-08-08 ENCOUNTER — Encounter: Payer: Self-pay | Admitting: Family Medicine

## 2020-08-08 ENCOUNTER — Other Ambulatory Visit: Payer: Self-pay

## 2020-08-08 VITALS — BP 124/70 | HR 88 | Temp 96.7°F | Ht 70.0 in | Wt 201.0 lb

## 2020-08-08 DIAGNOSIS — L819 Disorder of pigmentation, unspecified: Secondary | ICD-10-CM

## 2020-08-08 DIAGNOSIS — Z23 Encounter for immunization: Secondary | ICD-10-CM

## 2020-08-08 DIAGNOSIS — Z0000 Encounter for general adult medical examination without abnormal findings: Secondary | ICD-10-CM | POA: Diagnosis not present

## 2020-08-08 DIAGNOSIS — L821 Other seborrheic keratosis: Secondary | ICD-10-CM

## 2020-08-08 LAB — URINALYSIS, ROUTINE W REFLEX MICROSCOPIC
Bilirubin Urine: NEGATIVE
Hgb urine dipstick: NEGATIVE
Ketones, ur: NEGATIVE
Leukocytes,Ua: NEGATIVE
Nitrite: NEGATIVE
RBC / HPF: NONE SEEN (ref 0–?)
Specific Gravity, Urine: 1.01 (ref 1.000–1.030)
Total Protein, Urine: NEGATIVE
Urine Glucose: NEGATIVE
Urobilinogen, UA: 0.2 (ref 0.0–1.0)
pH: 7.5 (ref 5.0–8.0)

## 2020-08-08 LAB — LIPID PANEL
Cholesterol: 159 mg/dL (ref 0–200)
HDL: 34.1 mg/dL — ABNORMAL LOW (ref >39.00)
LDL Cholesterol: 109 mg/dL — ABNORMAL HIGH (ref 0–99)
NonHDL: 124.95
Total CHOL/HDL Ratio: 5
Triglycerides: 80 mg/dL (ref 0.0–149.0)
VLDL: 16 mg/dL (ref 0.0–40.0)

## 2020-08-08 LAB — LDL CHOLESTEROL, DIRECT: Direct LDL: 115 mg/dL

## 2020-08-08 LAB — COMPREHENSIVE METABOLIC PANEL
ALT: 49 U/L (ref 0–53)
AST: 22 U/L (ref 0–37)
Albumin: 4.7 g/dL (ref 3.5–5.2)
Alkaline Phosphatase: 54 U/L (ref 39–117)
BUN: 15 mg/dL (ref 6–23)
CO2: 32 mEq/L (ref 19–32)
Calcium: 9.5 mg/dL (ref 8.4–10.5)
Chloride: 103 mEq/L (ref 96–112)
Creatinine, Ser: 1.06 mg/dL (ref 0.40–1.50)
GFR: 88.51 mL/min (ref >60.00)
Glucose, Bld: 91 mg/dL (ref 70–99)
Potassium: 4.1 mEq/L (ref 3.5–5.1)
Sodium: 141 mEq/L (ref 135–145)
Total Bilirubin: 1.4 mg/dL — ABNORMAL HIGH (ref 0.2–1.2)
Total Protein: 6.7 g/dL (ref 6.0–8.3)

## 2020-08-08 LAB — CBC
HCT: 46.6 % (ref 39.0–52.0)
Hemoglobin: 15.6 g/dL (ref 13.0–17.0)
MCHC: 33.4 g/dL (ref 30.0–36.0)
MCV: 85.5 fl (ref 78.0–100.0)
Platelets: 190 10*3/uL (ref 150.0–400.0)
RBC: 5.45 Mil/uL (ref 4.22–5.81)
RDW: 13.8 % (ref 11.5–15.5)
WBC: 6.7 10*3/uL (ref 4.0–10.5)

## 2020-08-08 NOTE — Patient Instructions (Signed)
Preventive Care 21-39 Years Old, Male Preventive care refers to lifestyle choices and visits with your health care provider that can promote health and wellness. This includes:  A yearly physical exam. This is also called an annual wellness visit.  Regular dental and eye exams.  Immunizations.  Screening for certain conditions.  Healthy lifestyle choices, such as: ? Eating a healthy diet. ? Getting regular exercise. ? Not using drugs or products that contain nicotine and tobacco. ? Limiting alcohol use. What can I expect for my preventive care visit? Physical exam Your health care provider may check your:  Height and weight. These may be used to calculate your BMI (body mass index). BMI is a measurement that tells if you are at a healthy weight.  Heart rate and blood pressure.  Body temperature.  Skin for abnormal spots. Counseling Your health care provider may ask you questions about your:  Past medical problems.  Family's medical history.  Alcohol, tobacco, and drug use.  Emotional well-being.  Home life and relationship well-being.  Sexual activity.  Diet, exercise, and sleep habits.  Work and work environment.  Access to firearms. What immunizations do I need? Vaccines are usually given at various ages, according to a schedule. Your health care provider will recommend vaccines for you based on your age, medical history, and lifestyle or other factors, such as travel or where you work.   What tests do I need? Blood tests  Lipid and cholesterol levels. These may be checked every 5 years starting at age 20.  Hepatitis C test.  Hepatitis B test. Screening  Diabetes screening. This is done by checking your blood sugar (glucose) after you have not eaten for a while (fasting).  Genital exam to check for testicular cancer or hernias.  STD (sexually transmitted disease) testing, if you are at risk. Talk with your health care provider about your test results,  treatment options, and if necessary, the need for more tests.   Follow these instructions at home: Eating and drinking  Eat a healthy diet that includes fresh fruits and vegetables, whole grains, lean protein, and low-fat dairy products.  Drink enough fluid to keep your urine pale yellow.  Take vitamin and mineral supplements as recommended by your health care provider.  Do not drink alcohol if your health care provider tells you not to drink.  If you drink alcohol: ? Limit how much you have to 0-2 drinks a day. ? Be aware of how much alcohol is in your drink. In the [place] one drink equals one 12 oz bottle of beer (355 mL), one 5 oz glass of wine (148 mL), or one 1 oz glass of hard liquor (44 mL).   Lifestyle  Take daily care of your teeth and gums. Brush your teeth every morning and night with fluoride toothpaste. Floss one time each day.  Stay active. Exercise for at least 30 minutes 5 or more days each week.  Do not use any products that contain nicotine or tobacco, such as cigarettes, e-cigarettes, and chewing tobacco. If you need help quitting, ask your health care provider.  Do not use drugs.  If you are sexually active, practice safe sex. Use a condom or other form of protection to prevent STIs (sexually transmitted infections).  Find healthy ways to cope with stress, such as: ? Meditation, yoga, or listening to music. ? Journaling. ? Talking to a trusted person. ? Spending time with friends and family. Safety  Always wear your seat belt while driving   or riding in a vehicle.  Do not drive: ? If you have been drinking alcohol. Do not ride with someone who has been drinking. ? When you are tired or distracted. ? While texting.  Wear a helmet and other protective equipment during sports activities.  If you have firearms in your house, make sure you follow all gun safety procedures.  Seek help if you have been physically or sexually abused. What's next?  Go to your  health care provider once a year for an annual wellness visit.  Ask your health care provider how often you should have your eyes and teeth checked.  Stay up to date on all vaccines. This information is not intended to replace advice given to you by your health care provider. Make sure you discuss any questions you have with your health care provider. Document Revised: 12/01/2018 Document Reviewed: 03/11/2018 Elsevier Patient Education  2021 [place] Maintenance, Male Adopting a healthy lifestyle and getting preventive care are important in promoting health and wellness. Ask your health care provider about:  The right schedule for you to have regular tests and exams.  Things you can do on your own to prevent diseases and keep yourself healthy. What should I know about diet, weight, and exercise? Eat a healthy diet  Eat a diet that includes plenty of vegetables, fruits, low-fat dairy products, and lean protein.  Do not eat a lot of foods that are high in solid fats, added sugars, or sodium.   Maintain a healthy weight Body mass index (BMI) is a measurement that can be used to identify possible weight problems. It estimates body fat based on height and weight. Your health care provider can help determine your BMI and help you achieve or maintain a healthy weight. Get regular exercise Get regular exercise. This is one of the most important things you can do for your health. Most adults should:  Exercise for at least 150 minutes each week. The exercise should increase your heart rate and make you sweat (moderate-intensity exercise).  Do strengthening exercises at least twice a week. This is in addition to the moderate-intensity exercise.  Spend less time sitting. Even light physical activity can be beneficial. Watch cholesterol and blood lipids Have your blood tested for lipids and cholesterol at 39 years of age, then have this test every 5 years. You may need to have your  cholesterol levels checked more often if:  Your lipid or cholesterol levels are high.  You are older than 39 years of age.  You are at high risk for heart disease. What should I know about cancer screening? Many types of cancers can be detected early and may often be prevented. Depending on your health history and family history, you may need to have cancer screening at various ages. This may include screening for:  Colorectal cancer.  Prostate cancer.  Skin cancer.  Lung cancer. What should I know about heart disease, diabetes, and high blood pressure? Blood pressure and heart disease  High blood pressure causes heart disease and increases the risk of stroke. This is more likely to develop in people who have high blood pressure readings, are of African descent, or are overweight.  Talk with your health care provider about your target blood pressure readings.  Have your blood pressure checked: ? Every 3-5 years if you are 53-78 years of age. ? Every year if you are 33 years old or older.  If you are between the ages of 44 and  75 and are a current or former smoker, ask your health care provider if you should have a one-time screening for abdominal aortic aneurysm (AAA). Diabetes Have regular diabetes screenings. This checks your fasting blood sugar level. Have the screening done:  Once every three years after age 65 if you are at a normal weight and have a low risk for diabetes.  More often and at a younger age if you are overweight or have a high risk for diabetes. What should I know about preventing infection? Hepatitis B If you have a higher risk for hepatitis B, you should be screened for this virus. Talk with your health care provider to find out if you are at risk for hepatitis B infection. Hepatitis C Blood testing is recommended for:  Everyone born from [birth year] through 1964.  Anyone with known risk factors for hepatitis C. Sexually transmitted infections (STIs)  You  should be screened each year for STIs, including gonorrhea and chlamydia, if: ? You are sexually active and are younger than 39 years of age. ? You are older than 39 years of age and your health care provider tells you that you are at risk for this type of infection. ? Your sexual activity has changed since you were last screened, and you are at increased risk for chlamydia or gonorrhea. Ask your health care provider if you are at risk.  Ask your health care provider about whether you are at high risk for HIV. Your health care provider may recommend a prescription medicine to help prevent HIV infection. If you choose to take medicine to prevent HIV, you should first get tested for HIV. You should then be tested every 3 months for as long as you are taking the medicine. Follow these instructions at home: Lifestyle  Do not use any products that contain nicotine or tobacco, such as cigarettes, e-cigarettes, and chewing tobacco. If you need help quitting, ask your health care provider.  Do not use street drugs.  Do not share needles.  Ask your health care provider for help if you need support or information about quitting drugs. Alcohol use  Do not drink alcohol if your health care provider tells you not to drink.  If you drink alcohol: ? Limit how much you have to 0-2 drinks a day. ? Be aware of how much alcohol is in your drink. In the [place] one drink equals one 12 oz bottle of beer (355 mL), one 5 oz glass of wine (148 mL), or one 1 oz glass of hard liquor (44 mL). General instructions  Schedule regular health, dental, and eye exams.  Stay current with your vaccines.  Tell your health care provider if: ? You often feel depressed. ? You have ever been abused or do not feel safe at home. Summary  Adopting a healthy lifestyle and getting preventive care are important in promoting health and wellness.  Follow your health care provider's instructions about healthy diet, exercising, and  getting tested or screened for diseases.  Follow your health care provider's instructions on monitoring your cholesterol and blood pressure. This information is not intended to replace advice given to you by your health care provider. Make sure you discuss any questions you have with your health care provider. Document Revised: 03/10/2018 Document Reviewed: 03/10/2018 Elsevier Patient Education  2021 [employer] Inc[employer]  Mindfulness-Based Stress Reduction Mindfulness-based stress reduction (MBSR) is a program that helps people learn to practice mindfulness. Mindfulness is the practice of intentionally paying attention to the present moment.  MBSR focuses on developing self-awareness, which allows you to respond to life stress without judgment or negative emotions. It can be learned and practiced through techniques such as education, breathing exercises, meditation, and yoga. MBSR includes several mindfulness techniques in one program. MBSR works best when you understand the treatment, are willing to try new things, and can commit to spending time practicing what you learn. MBSR training may include learning about:  How your emotions, thoughts, and reactions affect your body.  New ways to respond to things that cause negative thoughts to start (triggers).  How to notice your thoughts and let go of them.  Practicing awareness of everyday things that you normally do without thinking.  The techniques and goals of different types of meditation. What are the benefits of MBSR? MBSR can have many benefits, which include helping you to:  Develop self-awareness. This refers to knowing and understanding yourself.  Learn skills and attitudes that help you to participate in your own health care.  Learn new ways to care for yourself.  Be more accepting about how things are, and let things go.  Be less judgmental and approach things with an open mind.  Be patient with yourself and trust yourself  more. MBSR has also been shown to:  Reduce negative emotions, such as depression and anxiety.  Improve memory and focus.  Change how you sense and approach pain.  Boost your body's ability to fight infections.  Help you connect better with other people.  Improve your sense of well-being. Follow these instructions at home:  Find a local in-person or online MBSR program.  Set aside some time regularly for mindfulness practice.  Find a mindfulness practice that works best for you. This may include one or more of the following: ? Meditation. Meditation involves focusing your mind on a certain thought or activity. ? Breathing awareness exercises. These help you to stay present by focusing on your breath. ? Body scan. For this practice, you lie down and pay attention to each part of your body from head to toe. You can identify tension and soreness and intentionally relax parts of your body. ? Yoga. Yoga involves stretching and breathing, and it can improve your ability to move and be flexible. It can also provide an experience of testing your body's limits, which can help you release stress. ? Mindful eating. This way of eating involves focusing on the taste, texture, color, and smell of each bite of food. Because this slows down eating and helps you feel full sooner, it can be an important part of a weight-loss plan.  Find a podcast or recording that provides guidance for breathing awareness, body scan, or meditation exercises. You can listen to these any time when you have a free moment to rest without distractions.  Follow your treatment plan as told by your health care provider. This may include taking regular medicines and making changes to your diet or lifestyle as recommended.   How to practice mindfulness To do a basic awareness exercise:  Find a comfortable place to sit.  Pay attention to the present moment. Observe your thoughts, feelings, and surroundings just as they  are.  Avoid placing judgment on yourself, your feelings, or your surroundings. Make note of any judgment that comes up, and let it go.  Your mind may wander, and that is okay. Make note of when your thoughts drift, and return your attention to the present moment. To do basic mindfulness meditation:  Find a comfortable place to  sit. This may include a stable chair or a firm floor cushion. ? Sit upright with your back straight. Let your arms fall next to your side with your hands resting on your legs. ? If sitting in a chair, rest your feet flat on the floor. ? If sitting on a cushion, cross your legs in front of you.  Keep your head in a neutral position with your chin dropped slightly. Relax your jaw and rest the tip of your tongue on the roof of your mouth. Drop your gaze to the floor. You can close your eyes if you like.  Breathe normally and pay attention to your breath. Feel the air moving in and out of your nose. Feel your belly expanding and relaxing with each breath.  Your mind may wander, and that is okay. Make note of when your thoughts drift, and return your attention to your breath.  Avoid placing judgment on yourself, your feelings, or your surroundings. Make note of any judgment or feelings that come up, let them go, and bring your attention back to your breath.  When you are ready, lift your gaze or open your eyes. Pay attention to how your body feels after the meditation. Where to find more information You can find more information about MBSR from:  Your health care provider.  Community-based meditation centers or programs.  Programs offered near you. Summary  Mindfulness-based stress reduction (MBSR) is a program that teaches you how to intentionally pay attention to the present moment. It is used with other treatments to help you cope better with daily stress, emotions, and pain.  MBSR focuses on developing self-awareness, which allows you to respond to life stress  without judgment or negative emotions.  MBSR programs may involve learning different mindfulness practices, such as breathing exercises, meditation, yoga, body scan, or mindful eating. Find a mindfulness practice that works best for you, and set aside time for it on a regular basis. This information is not intended to replace advice given to you by your health care provider. Make sure you discuss any questions you have with your health care provider. Document Revised: 12/02/2019 Document Reviewed: 12/02/2019 Elsevier Patient Education  2021 [employer].

## 2020-08-08 NOTE — Progress Notes (Signed)
Established Patient Office Visit  Subjective:  Patient ID: Billy Phillips, male    DOB: [birth year]  Age: 40 y.o. MRN: [id number]  CC:  Chief Complaint  Patient presents with  . Annual Exam    CPE, no concerns. Patient fasting for labs.     HPI Billy Phillips presents for yearly physical.  Doing well.  Now has 3 children.  Newborn with 6 and 9-year-olds.  Status post a vasectomy a few weeks ago.  Everything is healed now.  Experienced more pain than he had expected.  Has not been exercising as much.  Work and family are doing well.  Sometimes does not sleep well.  Mind races.  Overall he is doing well.  Past Medical History:  Diagnosis Date  . Gilbert syndrome   . Hemorrhoid   . IBS (irritable bowel syndrome)     Past Surgical History:  Procedure Laterality Date  . CHOLECYSTECTOMY N/A 08/23/2014   Procedure: LAPAROSCOPIC CHOLECYSTECTOMY WITH INTRAOPERATIVE CHOLANGIOGRAM ;  Surgeon: Claud Kelp, MD;  Location: [facility];  Service: General;  Laterality: N/A;  . WISDOM TOOTH EXTRACTION      Family History  Problem Relation Age of Onset  . Hyperlipidemia Father   . Hypertension Father   . Hyperlipidemia Sister   . Colon cancer Maternal Grandmother   . Hyperlipidemia Paternal Grandmother   . Diabetes Paternal Grandfather   . Colon polyps Mother     Social History   Socioeconomic History  . Marital status: Married    Spouse name: [name]  . Number of children: 2  . Years of education: 35  . Highest education level: Not on file  Occupational History  . Occupation: Communications  Tobacco Use  . Smoking status: Never Smoker  . Smokeless tobacco: Never Used  Vaping Use  . Vaping Use: Never used  Substance and Sexual Activity  . Alcohol use: Yes    Alcohol/week: 0.0 standard drinks    Comment: rare  . Drug use: No  . Sexual activity: Yes    Partners: Female  Other Topics Concern  . Not on file  Social History Narrative   Fun: Workout, hang out with  the children   Denies religious beliefs effecting health care.    Social Determinants of Health   Financial Resource Strain: Not on file  Food Insecurity: Not on file  Transportation Needs: Not on file  Physical Activity: Not on file  Stress: Not on file  Social Connections: Not on file  Intimate Partner Violence: Not on file    Outpatient Medications Prior to Visit  Medication Sig Dispense Refill  . Triamcinolone Acetonide (NASACORT ALLERGY 24HR NA) Place into the nose.     No facility-administered medications prior to visit.    No Known Allergies  ROS Review of Systems  Constitutional: Negative.   HENT: Negative.   Eyes: Negative for photophobia and visual disturbance.  Respiratory: Negative.   Cardiovascular: Negative.   Gastrointestinal: Negative.   Endocrine: Negative for polyphagia and polyuria.  Genitourinary: Negative.   Musculoskeletal: Negative.   Skin: Negative.   Allergic/Immunologic: Negative for immunocompromised state.  Neurological: Negative for speech difficulty and weakness.  Psychiatric/Behavioral: Negative.       Objective:    Physical Exam Vitals and nursing note reviewed.  Constitutional:      General: He is not in acute distress.    Appearance: Normal appearance. He is not ill-appearing, toxic-appearing or diaphoretic.  HENT:     Head: Normocephalic and atraumatic.  Right Ear: Tympanic membrane, ear canal and external ear normal.     Left Ear: Tympanic membrane, ear canal and external ear normal.     Mouth/Throat:     Mouth: Mucous membranes are moist.     Pharynx: Oropharynx is clear. No oropharyngeal exudate or posterior oropharyngeal erythema.  Eyes:     General: No scleral icterus.       Right eye: No discharge.        Left eye: No discharge.     Extraocular Movements: Extraocular movements intact.     Conjunctiva/sclera: Conjunctivae normal.     Pupils: Pupils are equal, round, and reactive to light.  Neck:     Vascular: No  carotid bruit.  Cardiovascular:     Rate and Rhythm: Normal rate and regular rhythm.  Pulmonary:     Effort: Pulmonary effort is normal.     Breath sounds: Normal breath sounds.  Abdominal:     General: Abdomen is flat. Bowel sounds are normal. There is no distension.     Palpations: Abdomen is soft. There is no mass.     Tenderness: There is no abdominal tenderness. There is no guarding or rebound.     Hernia: No hernia is present.  Musculoskeletal:     Cervical back: Normal range of motion. No rigidity or tenderness.     Right lower leg: No edema.     Left lower leg: No edema.  Lymphadenopathy:     Cervical: No cervical adenopathy.  Skin:    General: Skin is warm and dry.       Neurological:     General: No focal deficit present.     Mental Status: He is alert and oriented to person, place, and time.  Psychiatric:        Mood and Affect: Mood normal.        Behavior: Behavior normal.     BP 124/70   Pulse 88   Temp (!) 96.7 F (35.9 C) (Temporal)   Ht 5\' 10"  (1.778 m)   Wt 201 lb (91.2 kg)   SpO2 99%   BMI 28.84 kg/m  Wt Readings from Last 3 Encounters:  08/08/20 201 lb (91.2 kg)  03/19/20 198 lb 4.5 oz (89.9 kg)  12/23/17 193 lb 6 oz (87.7 kg)     Health Maintenance Due  Topic Date Due  . HIV Screening  Never done  . Hepatitis C Screening  Never done  . TETANUS/TDAP  Never done    There are no preventive care reminders to display for this patient.  Lab Results  Component Value Date   TSH 1.58 12/23/2017   Lab Results  Component Value Date   WBC 7.2 09/25/2016   HGB 16.0 09/25/2016   HCT 46.7 09/25/2016   MCV 86.7 09/25/2016   PLT 201.0 09/25/2016   Lab Results  Component Value Date   NA 141 12/23/2017   K 4.0 12/23/2017   CO2 30 12/23/2017   GLUCOSE 94 12/23/2017   BUN 14 12/23/2017   CREATININE 1.08 12/23/2017   BILITOT 2.0 (H) 12/23/2017   ALKPHOS 51 12/23/2017   AST 13 12/23/2017   ALT 16 12/23/2017   PROT 6.5 12/23/2017   ALBUMIN  4.6 12/23/2017   CALCIUM 9.3 12/23/2017   ANIONGAP 10 08/03/2014   GFR 81.90 12/23/2017   Lab Results  Component Value Date   CHOL 137 12/23/2017   Lab Results  Component Value Date   HDL 38.20 (L) 12/23/2017  Lab Results  Component Value Date   LDLCALC 89 12/23/2017   Lab Results  Component Value Date   TRIG 47.0 12/23/2017   Lab Results  Component Value Date   CHOLHDL 4 12/23/2017   No results found for: HGBA1C    Assessment & Plan:   Problem List Items Addressed This Visit      Musculoskeletal and Integument   Pigmented skin lesion of uncertain behavior of torso   Relevant Orders   Ambulatory referral to Dermatology   Seborrheic keratoses   Relevant Orders   Ambulatory referral to Dermatology     Other   Healthcare maintenance   Relevant Orders   CBC   Comprehensive metabolic panel   LDL cholesterol, direct   Lipid panel   Urinalysis, Routine w reflex microscopic   Need for Tdap vaccination - Primary   Relevant Orders   Tdap vaccine greater than or equal to 7yo IM      No orders of the defined types were placed in this encounter.   Follow-up: Return in about 1 year (around 08/08/2021), or if symptoms worsen or fail to improve.  Given information on health maintenance and disease prevention.  Her stress recommended exercise by walking and mindfulness based stress reduction.  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  Mliss Sax, MD

## 2020-08-21 ENCOUNTER — Telehealth: Payer: Self-pay | Admitting: Family Medicine

## 2020-08-21 NOTE — Telephone Encounter (Signed)
Tried to call to see if pt still needs dermatology referral, lvm for pt to call back

## 2021-07-08 ENCOUNTER — Ambulatory Visit: Payer: 59 | Admitting: Family Medicine

## 2021-08-12 ENCOUNTER — Ambulatory Visit (INDEPENDENT_AMBULATORY_CARE_PROVIDER_SITE_OTHER): Payer: 59

## 2021-08-12 ENCOUNTER — Encounter: Payer: Self-pay | Admitting: Family Medicine

## 2021-08-12 ENCOUNTER — Ambulatory Visit (INDEPENDENT_AMBULATORY_CARE_PROVIDER_SITE_OTHER): Payer: 59 | Admitting: Family Medicine

## 2021-08-12 VITALS — BP 128/74 | HR 74 | Temp 96.9°F | Ht 70.0 in | Wt 205.6 lb

## 2021-08-12 DIAGNOSIS — Z0000 Encounter for general adult medical examination without abnormal findings: Secondary | ICD-10-CM

## 2021-08-12 DIAGNOSIS — S20211A Contusion of right front wall of thorax, initial encounter: Secondary | ICD-10-CM

## 2021-08-12 DIAGNOSIS — S20211D Contusion of right front wall of thorax, subsequent encounter: Secondary | ICD-10-CM | POA: Diagnosis not present

## 2021-08-12 LAB — CBC
HCT: 46.4 % (ref 39.0–52.0)
Hemoglobin: 15.5 g/dL (ref 13.0–17.0)
MCHC: 33.3 g/dL (ref 30.0–36.0)
MCV: 86 fl (ref 78.0–100.0)
Platelets: 192 10*3/uL (ref 150.0–400.0)
RBC: 5.4 Mil/uL (ref 4.22–5.81)
RDW: 14.2 % (ref 11.5–15.5)
WBC: 8 10*3/uL (ref 4.0–10.5)

## 2021-08-12 LAB — COMPREHENSIVE METABOLIC PANEL
ALT: 23 U/L (ref 0–53)
AST: 15 U/L (ref 0–37)
Albumin: 4.5 g/dL (ref 3.5–5.2)
Alkaline Phosphatase: 50 U/L (ref 39–117)
BUN: 18 mg/dL (ref 6–23)
CO2: 27 mEq/L (ref 19–32)
Calcium: 9.3 mg/dL (ref 8.4–10.5)
Chloride: 106 mEq/L (ref 96–112)
Creatinine, Ser: 1.12 mg/dL (ref 0.40–1.50)
GFR: 82.26 mL/min (ref >60.00)
Glucose, Bld: 101 mg/dL — ABNORMAL HIGH (ref 70–99)
Potassium: 4.1 mEq/L (ref 3.5–5.1)
Sodium: 138 mEq/L (ref 135–145)
Total Bilirubin: 1.5 mg/dL — ABNORMAL HIGH (ref 0.2–1.2)
Total Protein: 6.7 g/dL (ref 6.0–8.3)

## 2021-08-12 LAB — URINALYSIS, ROUTINE W REFLEX MICROSCOPIC
Bilirubin Urine: NEGATIVE
Hgb urine dipstick: NEGATIVE
Ketones, ur: NEGATIVE
Leukocytes,Ua: NEGATIVE
Nitrite: NEGATIVE
RBC / HPF: NONE SEEN (ref 0–?)
Specific Gravity, Urine: <=1.005 — AB (ref 1.000–1.030)
Total Protein, Urine: NEGATIVE
Urine Glucose: NEGATIVE
Urobilinogen, UA: 0.2 (ref 0.0–1.0)
WBC, UA: NONE SEEN (ref 0–?)
pH: 6 (ref 5.0–8.0)

## 2021-08-12 LAB — LIPID PANEL
Cholesterol: 142 mg/dL (ref 0–200)
HDL: 34.7 mg/dL — ABNORMAL LOW (ref >39.00)
LDL Cholesterol: 94 mg/dL (ref 0–99)
NonHDL: 107.15
Total CHOL/HDL Ratio: 4
Triglycerides: 65 mg/dL (ref 0.0–149.0)
VLDL: 13 mg/dL (ref 0.0–40.0)

## 2021-08-12 NOTE — Progress Notes (Signed)
? ?Established Patient Office Visit ? ?Subjective   ?Patient ID: Billy Phillips, male    DOB: Mar 11, 1981  Age: 41 y.o. MRN: [id number] ? ?Chief Complaint  ?Patient presents with  ? Annual Exam  ?  CPE, little pain/soreness on right side that come and go with movement. Patient fasting.   ? ? ?HPI here for yearly physical.  Has been doing well.  Chest wall injury several months ago that involved a fall to the ground.  He struck his right lateral chest wall on a metal chair.  It is healing but slowly. ? ? ? ?Review of Systems  ?Constitutional: Negative.   ?HENT: Negative.    ?Eyes:  Negative for blurred vision, discharge and redness.  ?Respiratory: Negative.    ?Cardiovascular: Negative.   ?Gastrointestinal:  Negative for abdominal pain and constipation.  ?Genitourinary: Negative.   ?Musculoskeletal: Negative.  Negative for myalgias.  ?Skin:  Negative for rash.  ?Neurological:  Negative for tingling, loss of consciousness and weakness.  ?Endo/Heme/Allergies:  Negative for polydipsia.  ? ?  ?Objective:  ?  ? ?BP 128/74 (BP Location: Right Arm, Patient Position: Sitting, Cuff Size: Normal)   Pulse 74   Temp (!) 96.9 ?F (36.1 ?C) (Temporal)   Ht 5\' 10"  (1.778 m)   Wt 205 lb 9.6 oz (93.3 kg)   SpO2 99%   BMI 29.50 kg/m?  ?Wt Readings from Last 3 Encounters:  ?08/12/21 205 lb 9.6 oz (93.3 kg)  ?08/08/20 201 lb (91.2 kg)  ?03/19/20 198 lb 4.5 oz (89.9 kg)  ? ?  ? ?Physical Exam ?Constitutional:   ?   General: He is not in acute distress. ?   Appearance: Normal appearance. He is not ill-appearing, toxic-appearing or diaphoretic.  ?HENT:  ?   Head: Normocephalic and atraumatic.  ?   Right Ear: External ear normal.  ?   Left Ear: External ear normal.  ?   Mouth/Throat:  ?   Mouth: Mucous membranes are moist.  ?   Pharynx: Oropharynx is clear. No oropharyngeal exudate or posterior oropharyngeal erythema.  ?Eyes:  ?   General: No scleral icterus.    ?   Right eye: No discharge.     ?   Left eye: No discharge.  ?    Extraocular Movements: Extraocular movements intact.  ?   Conjunctiva/sclera: Conjunctivae normal.  ?   Pupils: Pupils are equal, round, and reactive to light.  ?Cardiovascular:  ?   Rate and Rhythm: Normal rate and regular rhythm.  ?Pulmonary:  ?   Effort: Pulmonary effort is normal. No respiratory distress.  ?   Breath sounds: Normal breath sounds.  ?Abdominal:  ?   General: Bowel sounds are normal.  ?   Tenderness: There is no abdominal tenderness. There is no guarding.  ?   Hernia: There is no hernia in the left inguinal area or right inguinal area.  ?Genitourinary: ?   Penis: No hypospadias, erythema, tenderness, discharge, swelling or lesions.   ?   Testes:     ?   Right: Mass, tenderness or swelling not present. Right testis is descended.     ?   Left: Mass, tenderness or swelling not present. Left testis is descended.  ?   Epididymis:  ?   Right: Not inflamed or enlarged.  ?   Left: Not inflamed or enlarged.  ?Musculoskeletal:  ?   Cervical back: No rigidity or tenderness.  ?Lymphadenopathy:  ?   Lower Body: No right inguinal adenopathy. No left  inguinal adenopathy.  ?Skin: ?   General: Skin is warm and dry.  ?Neurological:  ?   Mental Status: He is alert and oriented to person, place, and time.  ?Psychiatric:     ?   Mood and Affect: Mood normal.     ?   Behavior: Behavior normal.  ? ? ? ?No results found for any visits on 08/12/21. ? ? ? ?The 10-year ASCVD risk score (Arnett DK, et al., 2019) is: 1.2% ? ?  ?Assessment & Plan:  ? ?Problem List Items Addressed This Visit   ? ?  ? Other  ? Healthcare maintenance - Primary  ? Relevant Orders  ? Urinalysis, Routine w reflex microscopic  ? Lipid panel  ? Comprehensive metabolic panel  ? CBC  ? ?Other Visit Diagnoses   ? ? Contusion of right chest wall, initial encounter      ? Relevant Orders  ? DG Ribs Unilateral Right  ? ?  ? ? ?Return in about 1 year (around 08/13/2022).  ?We will continue exercising and working in the gym.  Suggested moderate upper Rappa  exercises until his chest wall injury heals.  Information was given on health maintenance and disease prevention.  Follow-up in 1 year ? ?Mliss Sax, MD ? ?

## 2022-01-30 ENCOUNTER — Encounter: Payer: Self-pay | Admitting: Family Medicine

## 2022-01-30 DIAGNOSIS — L819 Disorder of pigmentation, unspecified: Secondary | ICD-10-CM

## 2022-02-27 ENCOUNTER — Telehealth: Payer: Self-pay | Admitting: Family Medicine

## 2022-02-27 NOTE — Telephone Encounter (Signed)
Referral moved to [facility] New letter placed in Pt's chart & mychart with address and phone number

## 2022-02-27 NOTE — Telephone Encounter (Signed)
Caller Name: Billy Phillips Call back phone #: [phone]  Reason for Call: Pt is not happy with communication with dermatologist. He called 2 weeks ago to set an appointment and they have yet to call him back. Can this be transferred to another practice?

## 2022-12-11 ENCOUNTER — Ambulatory Visit (INDEPENDENT_AMBULATORY_CARE_PROVIDER_SITE_OTHER): Payer: 59 | Admitting: Family Medicine

## 2022-12-11 ENCOUNTER — Encounter: Payer: Self-pay | Admitting: Family Medicine

## 2022-12-11 VITALS — BP 126/80 | HR 62 | Temp 98.6°F | Ht 70.0 in | Wt 213.8 lb

## 2022-12-11 DIAGNOSIS — Z23 Encounter for immunization: Secondary | ICD-10-CM

## 2022-12-11 DIAGNOSIS — Z5320 Procedure and treatment not carried out because of patient's decision for unspecified reasons: Secondary | ICD-10-CM | POA: Insufficient documentation

## 2022-12-11 DIAGNOSIS — Z131 Encounter for screening for diabetes mellitus: Secondary | ICD-10-CM

## 2022-12-11 DIAGNOSIS — Z0000 Encounter for general adult medical examination without abnormal findings: Secondary | ICD-10-CM

## 2022-12-11 DIAGNOSIS — Z13220 Encounter for screening for lipoid disorders: Secondary | ICD-10-CM | POA: Diagnosis not present

## 2022-12-11 LAB — COMPREHENSIVE METABOLIC PANEL
ALT: 25 U/L (ref 0–53)
AST: 17 U/L (ref 0–37)
Albumin: 4.6 g/dL (ref 3.5–5.2)
Alkaline Phosphatase: 55 U/L (ref 39–117)
BUN: 16 mg/dL (ref 6–23)
CO2: 28 meq/L (ref 19–32)
Calcium: 9.6 mg/dL (ref 8.4–10.5)
Chloride: 104 meq/L (ref 96–112)
Creatinine, Ser: 1.22 mg/dL (ref 0.40–1.50)
GFR: 73.55 mL/min (ref >60.00)
Glucose, Bld: 89 mg/dL (ref 70–99)
Potassium: 4.9 meq/L (ref 3.5–5.1)
Sodium: 141 meq/L (ref 135–145)
Total Bilirubin: 2.1 mg/dL — ABNORMAL HIGH (ref 0.2–1.2)
Total Protein: 6.8 g/dL (ref 6.0–8.3)

## 2022-12-11 LAB — URINALYSIS, ROUTINE W REFLEX MICROSCOPIC
Bilirubin Urine: NEGATIVE
Hgb urine dipstick: NEGATIVE
Ketones, ur: NEGATIVE
Leukocytes,Ua: NEGATIVE
Nitrite: NEGATIVE
RBC / HPF: NONE SEEN (ref 0–?)
Specific Gravity, Urine: <=1.005 — AB (ref 1.000–1.030)
Total Protein, Urine: NEGATIVE
Urine Glucose: NEGATIVE
Urobilinogen, UA: 0.2 (ref 0.0–1.0)
WBC, UA: NONE SEEN (ref 0–?)
pH: 7 (ref 5.0–8.0)

## 2022-12-11 LAB — LIPID PANEL
Cholesterol: 168 mg/dL (ref 0–200)
HDL: 38 mg/dL — ABNORMAL LOW (ref >39.00)
LDL Cholesterol: 118 mg/dL — ABNORMAL HIGH (ref 0–99)
NonHDL: 130.43
Total CHOL/HDL Ratio: 4
Triglycerides: 64 mg/dL (ref 0.0–149.0)
VLDL: 12.8 mg/dL (ref 0.0–40.0)

## 2022-12-11 LAB — CBC
HCT: 46.9 % (ref 39.0–52.0)
Hemoglobin: 15.5 g/dL (ref 13.0–17.0)
MCHC: 33 g/dL (ref 30.0–36.0)
MCV: 86.5 fl (ref 78.0–100.0)
Platelets: 211 10*3/uL (ref 150.0–400.0)
RBC: 5.42 Mil/uL (ref 4.22–5.81)
RDW: 13.5 % (ref 11.5–15.5)
WBC: 7.8 10*3/uL (ref 4.0–10.5)

## 2022-12-11 LAB — HEMOGLOBIN A1C: Hgb A1c MFr Bld: 5.7 % (ref 4.6–6.5)

## 2022-12-11 NOTE — Progress Notes (Signed)
Established Patient Office Visit   Subjective:  Patient ID: Billy Phillips, male    DOB: 08-17-1980  Age: 42 y.o. MRN: [id number]  Chief Complaint  Patient presents with   Annual Exam    Cpe. Pt is fasting. No concerns.     HPI Encounter Diagnoses  Name Primary?   Healthcare maintenance Yes   Need for immunization against influenza    Screening for cholesterol level    Screening for diabetes mellitus    Screening for hepatitis C declined    In for yearly physical today.  Doing well.  Busy household.  Pends with his wife and 3 children ages 12, 1, and 3.  He is exercising regularly and has regular dental care.   Review of Systems  Constitutional: Negative.   HENT: Negative.    Eyes:  Negative for blurred vision, discharge and redness.  Respiratory: Negative.    Cardiovascular: Negative.   Gastrointestinal:  Negative for abdominal pain.  Genitourinary: Negative.   Musculoskeletal: Negative.  Negative for myalgias.  Skin:  Negative for rash.  Neurological:  Negative for tingling, loss of consciousness and weakness.  Endo/Heme/Allergies:  Negative for polydipsia.     Current Outpatient Medications:    Digestive Enzymes (SUPER ENZYMES) TABS, , Disp: , Rfl:    Triamcinolone Acetonide (NASACORT ALLERGY 24HR NA), Place into the nose., Disp: , Rfl:    Objective:     BP 126/80   Pulse 62   Temp 98.6 F (37 C)   Ht 5\' 10"  (1.778 m)   Wt 213 lb 12.8 oz (97 kg)   SpO2 98%   BMI 30.68 kg/m  Wt Readings from Last 3 Encounters:  12/11/22 213 lb 12.8 oz (97 kg)  08/12/21 205 lb 9.6 oz (93.3 kg)  08/08/20 201 lb (91.2 kg)      Physical Exam Constitutional:      General: He is not in acute distress.    Appearance: Normal appearance. He is not ill-appearing, toxic-appearing or diaphoretic.  HENT:     Head: Normocephalic and atraumatic.     Right Ear: Tympanic membrane, ear canal and external ear normal.     Left Ear: Tympanic membrane, ear canal and external ear  normal.     Mouth/Throat:     Mouth: Mucous membranes are moist.     Pharynx: Oropharynx is clear. No oropharyngeal exudate or posterior oropharyngeal erythema.  Eyes:     General: No scleral icterus.       Right eye: No discharge.        Left eye: No discharge.     Extraocular Movements: Extraocular movements intact.     Conjunctiva/sclera: Conjunctivae normal.     Pupils: Pupils are equal, round, and reactive to light.  Cardiovascular:     Rate and Rhythm: Normal rate and regular rhythm.  Pulmonary:     Effort: Pulmonary effort is normal. No respiratory distress.     Breath sounds: Normal breath sounds.  Abdominal:     General: Bowel sounds are normal.     Tenderness: There is no abdominal tenderness. There is no guarding.     Hernia: There is no hernia in the left inguinal area or right inguinal area.  Genitourinary:    Penis: No tenderness, discharge, swelling or lesions.      Testes:        Right: Mass, tenderness or swelling not present. Right testis is descended.        Left: Mass, tenderness or swelling not  present. Left testis is descended.     Epididymis:     Right: Not inflamed or enlarged.     Left: Not inflamed or enlarged.  Musculoskeletal:     Cervical back: No rigidity or tenderness.  Lymphadenopathy:     Cervical: No cervical adenopathy.     Lower Body: No right inguinal adenopathy. No left inguinal adenopathy.  Skin:    General: Skin is warm and dry.  Neurological:     Mental Status: He is alert and oriented to person, place, and time.  Psychiatric:        Mood and Affect: Mood normal.        Behavior: Behavior normal.      No results found for any visits on 12/11/22.    The 10-year ASCVD risk score (Arnett DK, et al., 2019) is: 1.1%    Assessment & Plan:   Healthcare maintenance -     CBC -     Comprehensive metabolic panel -     Urinalysis, Routine w reflex microscopic  Need for immunization against influenza -     Flu vaccine trivalent  PF, 6mos and older(Flulaval,Afluria,Fluarix,Fluzone)  Screening for cholesterol level -     Lipid panel  Screening for diabetes mellitus -     Hemoglobin A1c  Screening for hepatitis C declined    Return in about 1 year (around 12/11/2023), or if symptoms worsen or fail to improve.  Information was given on health maintenance and disease prevention.  Continue exercising.  Mliss Sax, MD

## 2023-04-22 ENCOUNTER — Encounter: Payer: Self-pay | Admitting: Family Medicine

## 2023-04-24 ENCOUNTER — Ambulatory Visit (INDEPENDENT_AMBULATORY_CARE_PROVIDER_SITE_OTHER): Payer: 59 | Admitting: Family Medicine

## 2023-04-24 ENCOUNTER — Encounter: Payer: Self-pay | Admitting: Family Medicine

## 2023-04-24 VITALS — BP 128/84 | HR 59 | Temp 97.7°F | Ht 70.0 in | Wt 208.8 lb

## 2023-04-24 DIAGNOSIS — R058 Other specified cough: Secondary | ICD-10-CM | POA: Diagnosis not present

## 2023-04-24 DIAGNOSIS — Z1152 Encounter for screening for COVID-19: Secondary | ICD-10-CM | POA: Diagnosis not present

## 2023-04-24 LAB — CBC WITH DIFFERENTIAL/PLATELET
Basophils Absolute: 0 10*3/uL (ref 0.0–0.1)
Basophils Relative: 0.2 % (ref 0.0–3.0)
Eosinophils Absolute: 0.2 10*3/uL (ref 0.0–0.7)
Eosinophils Relative: 2.8 % (ref 0.0–5.0)
HCT: 44.5 % (ref 39.0–52.0)
Hemoglobin: 14.7 g/dL (ref 13.0–17.0)
Lymphocytes Relative: 25.4 % (ref 12.0–46.0)
Lymphs Abs: 1.9 10*3/uL (ref 0.7–4.0)
MCHC: 33.1 g/dL (ref 30.0–36.0)
MCV: 86.3 fL (ref 78.0–100.0)
Monocytes Absolute: 0.6 10*3/uL (ref 0.1–1.0)
Monocytes Relative: 7.6 % (ref 3.0–12.0)
Neutro Abs: 4.8 10*3/uL (ref 1.4–7.7)
Neutrophils Relative %: 64 % (ref 43.0–77.0)
Platelets: 272 10*3/uL (ref 150.0–400.0)
RBC: 5.16 Mil/uL (ref 4.22–5.81)
RDW: 13.8 % (ref 11.5–15.5)
WBC: 7.4 10*3/uL (ref 4.0–10.5)

## 2023-04-24 LAB — POCT INFLUENZA A/B
Influenza A, POC: NEGATIVE
Influenza B, POC: NEGATIVE

## 2023-04-24 LAB — POC COVID19 BINAXNOW: SARS Coronavirus 2 Ag: NEGATIVE

## 2023-04-24 MED ORDER — BENZONATATE 200 MG PO CAPS: 200.0000 mg | ORAL_CAPSULE | Freq: Two times a day (BID) | ORAL | 0 refills | Status: AC | PRN

## 2023-04-24 MED ORDER — PREDNISONE 10 MG PO TABS: 10.0000 mg | ORAL_TABLET | Freq: Two times a day (BID) | ORAL | 0 refills | Status: AC

## 2023-04-24 NOTE — Progress Notes (Signed)
Established Patient Office Visit   Subjective:  Patient ID: Billy Phillips, male    DOB: 06/25/1980  Age: 43 y.o. MRN: [id number]  Chief Complaint  Patient presents with   Cough    Lingering cough x 1 month. Pt was dx with pneumonia 1 month ago. Still having productive cough and congestion, inflammed taste buds.     Cough Pertinent negatives include no eye redness, myalgias, rash, shortness of breath or wheezing.   Encounter Diagnoses  Name Primary?   Encounter for screening for COVID-19 Yes   Post-viral cough syndrome    For follow-up of a cough over the last 3 to 4 weeks.  Was seen in urgent care back on the seventh and diagnosed with a pneumonia by chest x-ray.  Was treated with Augmentin and Zithromax.  Patient is no longer febrile.  Cough is dry.  There is no wheezing or difficulty breathing no history of asthma or reactive airway disease.  Patient has no smoking history.   Review of Systems  Constitutional: Negative.   HENT: Negative.    Eyes:  Negative for blurred vision, discharge and redness.  Respiratory:  Positive for cough. Negative for sputum production, shortness of breath and wheezing.   Cardiovascular: Negative.   Gastrointestinal:  Negative for abdominal pain.  Genitourinary: Negative.   Musculoskeletal: Negative.  Negative for myalgias.  Skin:  Negative for rash.  Neurological:  Negative for tingling, loss of consciousness and weakness.  Endo/Heme/Allergies:  Negative for polydipsia.     Current Outpatient Medications:    Azelastine HCl 137 MCG/SPRAY SOLN, SMARTSIG:1-2 Spray(s) Both Nares Twice Daily PRN, Disp: , Rfl:    benzonatate (TESSALON) 200 MG capsule, Take 1 capsule (200 mg total) by mouth 2 (two) times daily as needed for cough., Disp: 20 capsule, Rfl: 0   levocetirizine (XYZAL) 5 MG tablet, SMARTSIG:1 Tablet(s) By Mouth Every Evening, Disp: , Rfl:    predniSONE (DELTASONE) 10 MG tablet, Take 1 tablet (10 mg total) by mouth 2 (two) times  daily with a meal for 7 days., Disp: 14 tablet, Rfl: 0   Digestive Enzymes (SUPER ENZYMES) TABS, , Disp: , Rfl:    Triamcinolone Acetonide (NASACORT ALLERGY 24HR NA), Place into the nose. (Patient not taking: Reported on 04/24/2023), Disp: , Rfl:    Objective:     BP 128/84   Pulse (!) 59   Temp 97.7 F (36.5 C)   Ht 5\' 10"  (1.778 m)   Wt 208 lb 12.8 oz (94.7 kg)   SpO2 96%   BMI 29.96 kg/m    Physical Exam Constitutional:      General: He is not in acute distress.    Appearance: Normal appearance. He is not ill-appearing, toxic-appearing or diaphoretic.  HENT:     Head: Normocephalic and atraumatic.     Right Ear: External ear normal.     Left Ear: External ear normal.     Mouth/Throat:     Mouth: Mucous membranes are moist.     Pharynx: Oropharynx is clear. No oropharyngeal exudate or posterior oropharyngeal erythema.  Eyes:     General: No scleral icterus.       Right eye: No discharge.        Left eye: No discharge.     Extraocular Movements: Extraocular movements intact.     Conjunctiva/sclera: Conjunctivae normal.     Pupils: Pupils are equal, round, and reactive to light.  Cardiovascular:     Rate and Rhythm: Normal rate and regular rhythm.  Pulmonary:     Effort: Pulmonary effort is normal. No respiratory distress.     Breath sounds: Normal breath sounds. No wheezing, rhonchi or rales.  Musculoskeletal:     Cervical back: No rigidity or tenderness.  Skin:    General: Skin is warm and dry.  Neurological:     Mental Status: He is alert and oriented to person, place, and time.  Psychiatric:        Mood and Affect: Mood normal.        Behavior: Behavior normal.      Results for orders placed or performed in visit on 04/24/23  POC COVID-19 BinaxNow  Result Value Ref Range   SARS Coronavirus 2 Ag Negative Negative  POCT Influenza A/B  Result Value Ref Range   Influenza A, POC Negative Negative   Influenza B, POC Negative Negative      The 10-year  ASCVD risk score (Arnett DK, et al., 2019) is: 1.5%    Assessment & Plan:   Encounter for screening for COVID-19 -     POC COVID-19 BinaxNow -     POCT Influenza A/B  Post-viral cough syndrome -     CBC with Differential/Platelet -     predniSONE; Take 1 tablet (10 mg total) by mouth 2 (two) times daily with a meal for 7 days.  Dispense: 14 tablet; Refill: 0 -     Benzonatate; Take 1 capsule (200 mg total) by mouth 2 (two) times daily as needed for cough.  Dispense: 20 capsule; Refill: 0 -     DG Chest 2 View; Future    Return if symptoms worsen or fail to improve, for Schedule chest x-ray for the seventh of next month..  Postinflammatory cough syndrome.  Will treat with lower dose prednisone 10 mg twice daily.  Plan.  Scheduled for follow-up chest x-ray on the seventh of the month.  Mliss Sax, MD

## 2023-04-27 ENCOUNTER — Encounter: Payer: Self-pay | Admitting: Family Medicine

## 2023-04-28 ENCOUNTER — Ambulatory Visit: Payer: 59 | Admitting: Family Medicine

## 2023-12-14 ENCOUNTER — Encounter: Payer: 59 | Admitting: Family Medicine

## 2024-01-07 ENCOUNTER — Ambulatory Visit (INDEPENDENT_AMBULATORY_CARE_PROVIDER_SITE_OTHER): Admitting: Family Medicine

## 2024-01-07 ENCOUNTER — Encounter: Payer: Self-pay | Admitting: Family Medicine

## 2024-01-07 ENCOUNTER — Ambulatory Visit: Payer: Self-pay | Admitting: Family Medicine

## 2024-01-07 VITALS — BP 128/84 | HR 62 | Temp 95.8°F | Ht 70.0 in | Wt 217.4 lb

## 2024-01-07 DIAGNOSIS — F439 Reaction to severe stress, unspecified: Secondary | ICD-10-CM | POA: Diagnosis not present

## 2024-01-07 DIAGNOSIS — Z0000 Encounter for general adult medical examination without abnormal findings: Secondary | ICD-10-CM | POA: Diagnosis not present

## 2024-01-07 DIAGNOSIS — Z13220 Encounter for screening for lipoid disorders: Secondary | ICD-10-CM

## 2024-01-07 DIAGNOSIS — Z131 Encounter for screening for diabetes mellitus: Secondary | ICD-10-CM

## 2024-01-07 LAB — CBC WITH DIFFERENTIAL/PLATELET
Basophils Absolute: 0 K/uL (ref 0.0–0.1)
Basophils Relative: 0.2 % (ref 0.0–3.0)
Eosinophils Absolute: 0.2 K/uL (ref 0.0–0.7)
Eosinophils Relative: 3.1 % (ref 0.0–5.0)
HCT: 47.5 % (ref 39.0–52.0)
Hemoglobin: 15.9 g/dL (ref 13.0–17.0)
Lymphocytes Relative: 25.9 % (ref 12.0–46.0)
Lymphs Abs: 2 K/uL (ref 0.7–4.0)
MCHC: 33.4 g/dL (ref 30.0–36.0)
MCV: 85.4 fl (ref 78.0–100.0)
Monocytes Absolute: 0.4 K/uL (ref 0.1–1.0)
Monocytes Relative: 5.3 % (ref 3.0–12.0)
Neutro Abs: 5.1 K/uL (ref 1.4–7.7)
Neutrophils Relative %: 65.5 % (ref 43.0–77.0)
Platelets: 234 K/uL (ref 150.0–400.0)
RBC: 5.56 Mil/uL (ref 4.22–5.81)
RDW: 13.2 % (ref 11.5–15.5)
WBC: 7.8 K/uL (ref 4.0–10.5)

## 2024-01-07 LAB — COMPREHENSIVE METABOLIC PANEL WITH GFR
ALT: 33 U/L (ref 0–53)
AST: 21 U/L (ref 0–37)
Albumin: 5.1 g/dL (ref 3.5–5.2)
Alkaline Phosphatase: 54 U/L (ref 39–117)
BUN: 14 mg/dL (ref 6–23)
CO2: 30 meq/L (ref 19–32)
Calcium: 9.7 mg/dL (ref 8.4–10.5)
Chloride: 103 meq/L (ref 96–112)
Creatinine, Ser: 1.15 mg/dL (ref 0.40–1.50)
GFR: 78.36 mL/min (ref >60.00)
Glucose, Bld: 100 mg/dL — ABNORMAL HIGH (ref 70–99)
Potassium: 4.6 meq/L (ref 3.5–5.1)
Sodium: 140 meq/L (ref 135–145)
Total Bilirubin: 1.9 mg/dL — ABNORMAL HIGH (ref 0.2–1.2)
Total Protein: 7.3 g/dL (ref 6.0–8.3)

## 2024-01-07 LAB — URINALYSIS, ROUTINE W REFLEX MICROSCOPIC
Bilirubin Urine: NEGATIVE
Hgb urine dipstick: NEGATIVE
Ketones, ur: NEGATIVE
Leukocytes,Ua: NEGATIVE
Nitrite: NEGATIVE
RBC / HPF: NONE SEEN (ref 0–?)
Specific Gravity, Urine: <=1.005 — AB (ref 1.000–1.030)
Total Protein, Urine: NEGATIVE
Urine Glucose: NEGATIVE
Urobilinogen, UA: 0.2 (ref 0.0–1.0)
pH: 6 (ref 5.0–8.0)

## 2024-01-07 LAB — LIPID PANEL
Cholesterol: 151 mg/dL (ref 0–200)
HDL: 33.4 mg/dL — ABNORMAL LOW (ref >39.00)
LDL Cholesterol: 104 mg/dL — ABNORMAL HIGH (ref 0–99)
NonHDL: 117.8
Total CHOL/HDL Ratio: 5
Triglycerides: 71 mg/dL (ref 0.0–149.0)
VLDL: 14.2 mg/dL (ref 0.0–40.0)

## 2024-01-07 LAB — HEMOGLOBIN A1C: Hgb A1c MFr Bld: 5.8 % (ref 4.6–6.5)

## 2024-01-07 NOTE — Progress Notes (Signed)
 Established Patient Office Visit   Subjective:  Patient ID: Billy Phillips, male    DOB: 06/07/1980  Age: 43 y.o. MRN: [id number]  Chief Complaint  Patient presents with   Annual Exam    Pt presents today for physical     HPI Encounter Diagnoses  Name Primary?   Healthcare maintenance Yes   Screening for diabetes mellitus    Screening for cholesterol level    Stress at home    Doing okay.  Things have been a little stressful at home.  His youngest child has been diagnosed with underlying autism.  He continues to exercise regularly and has regular dental care.  Work has been okay.  Older sister history of prediabetes and was diabetic during her pregnancies.   Review of Systems  Constitutional: Negative.   HENT: Negative.    Eyes:  Negative for blurred vision, discharge and redness.  Respiratory: Negative.    Cardiovascular: Negative.   Gastrointestinal:  Negative for abdominal pain.  Genitourinary: Negative.   Musculoskeletal: Negative.  Negative for myalgias.  Skin:  Negative for rash.  Neurological:  Negative for tingling, loss of consciousness and weakness.  Endo/Heme/Allergies:  Negative for polydipsia.      01/07/2024   10:59 AM 12/11/2022    9:58 AM 08/12/2021    8:41 AM  Depression screen PHQ 2/9  Decreased Interest 1 1 0  Down, Depressed, Hopeless 0 0 0  PHQ - 2 Score 1 1 0  Altered sleeping 2 0 0  Tired, decreased energy 2 2 0  Change in appetite 0 0 0  Feeling bad or failure about yourself  0 0 0  Trouble concentrating 3 0 2  Moving slowly or fidgety/restless 0 0 0  Suicidal thoughts 0 0 0  PHQ-9 Score 8 3 2   Difficult doing work/chores Somewhat difficult Not difficult at all Somewhat difficult      Current Outpatient Medications:    Azelastine HCl 137 MCG/SPRAY SOLN, SMARTSIG:1-2 Spray(s) Both Nares Twice Daily PRN, Disp: , Rfl:    benzonatate  (TESSALON ) 200 MG capsule, Take 1 capsule (200 mg total) by mouth 2 (two) times daily as needed for  cough., Disp: 20 capsule, Rfl: 0   levocetirizine (XYZAL) 5 MG tablet, SMARTSIG:1 Tablet(s) By Mouth Every Evening, Disp: , Rfl:    Digestive Enzymes (SUPER ENZYMES) TABS, , Disp: , Rfl:    Triamcinolone Acetonide (NASACORT ALLERGY 24HR NA), Place into the nose. (Patient not taking: Reported on 04/24/2023), Disp: , Rfl:    Objective:     BP 128/84 (BP Location: Right Arm, Patient Position: Sitting, Cuff Size: Large)   Pulse 62   Temp (!) 95.8 F (35.4 C) (Temporal)   Ht 5' 10 (1.778 m)   Wt 217 lb 6.4 oz (98.6 kg)   SpO2 98%   BMI 31.19 kg/m  BP Readings from Last 3 Encounters:  01/07/24 128/84  04/24/23 128/84  12/11/22 126/80   Wt Readings from Last 3 Encounters:  01/07/24 217 lb 6.4 oz (98.6 kg)  04/24/23 208 lb 12.8 oz (94.7 kg)  12/11/22 213 lb 12.8 oz (97 kg)      Physical Exam Constitutional:      General: He is not in acute distress.    Appearance: Normal appearance. He is not ill-appearing, toxic-appearing or diaphoretic.  HENT:     Head: Normocephalic and atraumatic.     Right Ear: Tympanic membrane, ear canal and external ear normal.     Left Ear: Tympanic membrane, ear canal  and external ear normal.     Mouth/Throat:     Mouth: Mucous membranes are moist.     Pharynx: Oropharynx is clear. No oropharyngeal exudate or posterior oropharyngeal erythema.  Eyes:     General: No scleral icterus.       Right eye: No discharge.        Left eye: No discharge.     Extraocular Movements: Extraocular movements intact.     Conjunctiva/sclera: Conjunctivae normal.     Pupils: Pupils are equal, round, and reactive to light.  Cardiovascular:     Rate and Rhythm: Normal rate and regular rhythm.  Pulmonary:     Effort: Pulmonary effort is normal. No respiratory distress.     Breath sounds: Normal breath sounds. No wheezing or rales.  Abdominal:     General: Bowel sounds are normal.     Tenderness: There is no abdominal tenderness. There is no guarding or rebound.      Hernia: There is no hernia in the left inguinal area or right inguinal area.  Genitourinary:    Penis: Circumcised. No hypospadias, erythema, tenderness, discharge, swelling or lesions.      Testes:        Right: Tenderness or swelling not present. Right testis is descended. Cremasteric reflex is present.         Left: Tenderness or swelling not present. Left testis is descended. Cremasteric reflex is present.      Epididymis:     Right: Not inflamed or enlarged. No mass.     Left: Not inflamed or enlarged. No mass.  Musculoskeletal:     Cervical back: No rigidity or tenderness.  Lymphadenopathy:     Lower Body: No right inguinal adenopathy. No left inguinal adenopathy.  Skin:    General: Skin is warm and dry.  Neurological:     Mental Status: He is alert and oriented to person, place, and time.  Psychiatric:        Mood and Affect: Mood normal.        Behavior: Behavior normal.      No results found for any visits on 01/07/24.    The 10-year ASCVD risk score (Arnett DK, et al., 2019) is: 1.5%    Assessment & Plan:   Healthcare maintenance -     CBC with Differential/Platelet -     Urinalysis, Routine w reflex microscopic  Screening for diabetes mellitus -     Comprehensive metabolic panel with GFR -     Hemoglobin A1c  Screening for cholesterol level -     Comprehensive metabolic panel with GFR -     Lipid panel  Stress at home    Return in about 1 year (around 01/06/2025), or if symptoms worsen or fail to improve.  Encouraged regular exercise which is what he is already doing.  Information was given on mindfulness based stress reduction.  Information given on preventing type 2 diabetes.  Information given on health maintenance and disease  Elsie Sim Lent, MD

## 2025-01-12 ENCOUNTER — Encounter: Admitting: Family Medicine
